# Patient Record
Sex: Female | Born: 1995 | Race: Black or African American | Hispanic: No | Marital: Single | Smoking: Current some day smoker
Health system: Southern US, Community
[De-identification: ages and names within clinical notes are randomized; demographics above are authoritative.]

---
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
## 2018-01-25 ENCOUNTER — Other Ambulatory Visit: Payer: Self-pay

## 2018-01-25 ENCOUNTER — Emergency Department: Payer: No Typology Code available for payment source

## 2018-01-25 ENCOUNTER — Encounter: Payer: Self-pay | Admitting: Emergency Medicine

## 2018-01-25 ENCOUNTER — Emergency Department
Admission: EM | Admit: 2018-01-25 | Discharge: 2018-01-25 | Disposition: A | Discharge status: Home / Self Care | Payer: No Typology Code available for payment source | Attending: Emergency Medicine | Admitting: Emergency Medicine

## 2018-01-25 DIAGNOSIS — Y92410 Unspecified street and highway as the place of occurrence of the external cause: Secondary | ICD-10-CM | POA: Diagnosis not present

## 2018-01-25 DIAGNOSIS — Y999 Unspecified external cause status: Secondary | ICD-10-CM | POA: Insufficient documentation

## 2018-01-25 DIAGNOSIS — S161XXA Strain of muscle, fascia and tendon at neck level, initial encounter: Secondary | ICD-10-CM | POA: Insufficient documentation

## 2018-01-25 DIAGNOSIS — Y9389 Activity, other specified: Secondary | ICD-10-CM | POA: Diagnosis not present

## 2018-01-25 DIAGNOSIS — S199XXA Unspecified injury of neck, initial encounter: Secondary | ICD-10-CM | POA: Diagnosis present

## 2018-01-25 DIAGNOSIS — S29019A Strain of muscle and tendon of unspecified wall of thorax, initial encounter: Secondary | ICD-10-CM | POA: Diagnosis not present

## 2018-01-25 LAB — POCT PREGNANCY, URINE: Preg Test, Ur: NEGATIVE

## 2018-01-25 MED ORDER — IBUPROFEN 600 MG PO TABS: 600.0000 mg | ORAL_TABLET | Freq: Three times a day (TID) | ORAL | 0 refills | Status: DC | PRN

## 2018-01-25 MED ORDER — IBUPROFEN 600 MG PO TABS
600.0000 mg | ORAL_TABLET | Freq: Once | ORAL | Status: AC
  Administered 2018-01-25: 600 mg via ORAL
  Filled 2018-01-25: qty 1

## 2018-01-25 MED ORDER — METHOCARBAMOL 500 MG PO TABS: 500.0000 mg | ORAL_TABLET | Freq: Four times a day (QID) | ORAL | 0 refills | Status: DC | PRN

## 2018-01-25 NOTE — Discharge Instructions (Signed)
Follow-up with your regular doctor if any continued problems.  Ibuprofen every 8 hours as needed for pain and inflammation.  Methocarbamol 1 tablet every 6 hours if needed for muscle spasms.  Do not take this medication and drive or operate machinery. Moist heat or ice to muscles as needed for discomfort or pain.
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
## 2018-01-25 NOTE — ED Notes (Signed)
Urine preg negative
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
## 2018-03-19 ENCOUNTER — Emergency Department: Payer: 59

## 2018-03-19 ENCOUNTER — Encounter: Payer: Self-pay | Admitting: Emergency Medicine

## 2018-03-19 ENCOUNTER — Emergency Department
Admission: EM | Admit: 2018-03-19 | Discharge: 2018-03-19 | Disposition: A | Discharge status: Home / Self Care | Payer: 59 | Attending: Emergency Medicine | Admitting: Emergency Medicine

## 2018-03-19 DIAGNOSIS — R202 Paresthesia of skin: Secondary | ICD-10-CM | POA: Insufficient documentation

## 2018-03-19 DIAGNOSIS — M546 Pain in thoracic spine: Secondary | ICD-10-CM

## 2018-03-19 DIAGNOSIS — M542 Cervicalgia: Secondary | ICD-10-CM | POA: Diagnosis not present

## 2018-03-19 LAB — URINALYSIS, COMPLETE (UACMP) WITH MICROSCOPIC
Bilirubin Urine: NEGATIVE
Glucose, UA: NEGATIVE mg/dL
Ketones, ur: NEGATIVE mg/dL
Leukocytes, UA: NEGATIVE
Nitrite: NEGATIVE
Protein, ur: NEGATIVE mg/dL
Specific Gravity, Urine: 1.015 (ref 1.005–1.030)
pH: 6 (ref 5.0–8.0)

## 2018-03-19 LAB — POCT PREGNANCY, URINE: Preg Test, Ur: NEGATIVE

## 2018-03-19 MED ORDER — PREDNISONE 50 MG PO TABS: ORAL_TABLET | ORAL | 0 refills | Status: DC

## 2018-03-19 NOTE — ED Notes (Signed)
ED Provider at bedside. 
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
## 2018-03-19 NOTE — ED Notes (Signed)
Pt ambulatory upon discharge. Verbalized understanding of discharge instructions, follow-up care and prescription. VSS. Skin warm and dry. A&O x4.  
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
## 2018-03-19 NOTE — ED Notes (Signed)
POC urine pregnancy test negative.
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
## 2018-03-19 NOTE — ED Notes (Signed)
XR aware that urine preg was negative. Pt ready for exam.

## 2020-05-27 ENCOUNTER — Emergency Department: Payer: Self-pay

## 2020-05-27 ENCOUNTER — Emergency Department
Admission: EM | Admit: 2020-05-27 | Discharge: 2020-05-27 | Disposition: A | Discharge status: Home / Self Care | Payer: Self-pay | Attending: Emergency Medicine | Admitting: Emergency Medicine

## 2020-05-27 ENCOUNTER — Other Ambulatory Visit: Payer: Self-pay

## 2020-05-27 ENCOUNTER — Encounter: Payer: Self-pay | Admitting: Emergency Medicine

## 2020-05-27 DIAGNOSIS — R197 Diarrhea, unspecified: Secondary | ICD-10-CM | POA: Insufficient documentation

## 2020-05-27 DIAGNOSIS — J020 Streptococcal pharyngitis: Secondary | ICD-10-CM | POA: Insufficient documentation

## 2020-05-27 DIAGNOSIS — Z20822 Contact with and (suspected) exposure to covid-19: Secondary | ICD-10-CM | POA: Insufficient documentation

## 2020-05-27 LAB — URINALYSIS, COMPLETE (UACMP) WITH MICROSCOPIC
Bacteria, UA: NONE SEEN
Bilirubin Urine: NEGATIVE
Glucose, UA: NEGATIVE mg/dL
Ketones, ur: 5 mg/dL — AB
Leukocytes,Ua: NEGATIVE
Nitrite: NEGATIVE
Protein, ur: 30 mg/dL — AB
Specific Gravity, Urine: 1.016 (ref 1.005–1.030)
pH: 7 (ref 5.0–8.0)

## 2020-05-27 LAB — COMPREHENSIVE METABOLIC PANEL
ALT: 13 U/L (ref 0–44)
AST: 18 U/L (ref 15–41)
Albumin: 4.4 g/dL (ref 3.5–5.0)
Alkaline Phosphatase: 53 U/L (ref 38–126)
Anion gap: 10 (ref 5–15)
BUN: 6 mg/dL (ref 6–20)
CO2: 24 mmol/L (ref 22–32)
Calcium: 9.3 mg/dL (ref 8.9–10.3)
Chloride: 98 mmol/L (ref 98–111)
Creatinine, Ser: 0.89 mg/dL (ref 0.44–1.00)
GFR calc Af Amer: >60 mL/min (ref >60)
GFR calc non Af Amer: >60 mL/min (ref >60)
Glucose, Bld: 150 mg/dL — ABNORMAL HIGH (ref 70–99)
Potassium: 3.4 mmol/L — ABNORMAL LOW (ref 3.5–5.1)
Sodium: 132 mmol/L — ABNORMAL LOW (ref 135–145)
Total Bilirubin: 0.6 mg/dL (ref 0.3–1.2)
Total Protein: 8.8 g/dL — ABNORMAL HIGH (ref 6.5–8.1)

## 2020-05-27 LAB — LIPASE, BLOOD: Lipase: 27 U/L (ref 11–51)

## 2020-05-27 LAB — CBC
HCT: 38.6 % (ref 36.0–46.0)
Hemoglobin: 12.8 g/dL (ref 12.0–15.0)
MCH: 29.6 pg (ref 26.0–34.0)
MCHC: 33.2 g/dL (ref 30.0–36.0)
MCV: 89.4 fL (ref 80.0–100.0)
Platelets: 288 10*3/uL (ref 150–400)
RBC: 4.32 MIL/uL (ref 3.87–5.11)
RDW: 12.5 % (ref 11.5–15.5)
WBC: 10.9 10*3/uL — ABNORMAL HIGH (ref 4.0–10.5)
nRBC: 0 % (ref 0.0–0.2)

## 2020-05-27 LAB — MONONUCLEOSIS SCREEN: Mono Screen: NEGATIVE

## 2020-05-27 LAB — POCT PREGNANCY, URINE: Preg Test, Ur: NEGATIVE

## 2020-05-27 LAB — SARS CORONAVIRUS 2 BY RT PCR (HOSPITAL ORDER, PERFORMED IN ~~LOC~~ HOSPITAL LAB): SARS Coronavirus 2: NEGATIVE

## 2020-05-27 LAB — GROUP A STREP BY PCR: Group A Strep by PCR: NOT DETECTED

## 2020-05-27 MED ORDER — ONDANSETRON 4 MG PO TBDP
4.0000 mg | ORAL_TABLET | Freq: Three times a day (TID) | ORAL | 0 refills | Status: DC | PRN
Start: 2020-05-27 — End: 2021-03-27

## 2020-05-27 MED ORDER — AMOXICILLIN 500 MG PO CAPS
1000.0000 mg | ORAL_CAPSULE | Freq: Once | ORAL | Status: AC
  Administered 2020-05-27: 1000 mg via ORAL
  Filled 2020-05-27: qty 2

## 2020-05-27 MED ORDER — MAGIC MOUTHWASH W/LIDOCAINE
5.0000 mL | Freq: Four times a day (QID) | ORAL | 0 refills | Status: DC
Start: 2020-05-27 — End: 2022-05-27

## 2020-05-27 MED ORDER — SODIUM CHLORIDE 0.9 % IV BOLUS
1000.0000 mL | Freq: Once | INTRAVENOUS | Status: AC
  Administered 2020-05-27: 1000 mL via INTRAVENOUS

## 2020-05-27 MED ORDER — DEXAMETHASONE SODIUM PHOSPHATE 10 MG/ML IJ SOLN
10.0000 mg | Freq: Once | INTRAMUSCULAR | Status: AC
  Administered 2020-05-27: 10 mg via INTRAVENOUS
  Filled 2020-05-27: qty 1

## 2020-05-27 MED ORDER — AMOXICILLIN 875 MG PO TABS: 875.0000 mg | ORAL_TABLET | Freq: Two times a day (BID) | ORAL | 0 refills | Status: DC

## 2020-05-27 MED ORDER — ACETAMINOPHEN 325 MG PO TABS
650.0000 mg | ORAL_TABLET | Freq: Once | ORAL | Status: AC | PRN
  Administered 2020-05-27: 650 mg via ORAL
  Filled 2020-05-27: qty 2

## 2020-05-27 NOTE — ED Notes (Signed)
Tonsils swollen 3+ and red with white spots. Strep swab ordered.

## 2020-05-27 NOTE — ED Provider Notes (Signed)
[facility] Emergency Department Provider Note  ____________________________________________  Time seen: Approximately 2:20 PM  I have reviewed the triage vital signs and the nursing notes.   HISTORY  Chief Complaint Emesis    HPI Rachael Walsh is a 24 y.o. female who presents the emergency department complaining of sore throat,  nausea, vomiting, diarrhea, body aches.  Patient states that she has been having symptoms for 3 to 5 days.  Patient has no difficulty breathing or swallowing.  She is able to drink fluids but states that food makes her nauseated and causes emesis.  She has had multiple episodes of diarrhea but no hematemesis and no hematochezia.  Patient with no abdominal pain.  No cough or shortness of breath.  No medications prior to arrival.        History reviewed. No pertinent past medical history.  There are no problems to display for this patient.   History reviewed. No pertinent surgical history.  Prior to Admission medications   Medication Sig Start Date End Date Taking? Authorizing Provider  amoxicillin (AMOXIL) 875 MG tablet Take 1 tablet (875 mg total) by mouth 2 (two) times daily. 05/27/20   Samwise Eckardt, Charline Bills, PA-C  ibuprofen (ADVIL,MOTRIN) 600 MG tablet Take 1 tablet (600 mg total) by mouth every 8 (eight) hours as needed. 01/25/18   Johnn Hai, PA-C  magic mouthwash w/lidocaine SOLN Take 5 mLs by mouth 4 (four) times daily. 05/27/20   Aneesa Romey, Charline Bills, PA-C  methocarbamol (ROBAXIN) 500 MG tablet Take 1 tablet (500 mg total) by mouth every 6 (six) hours as needed for muscle spasms. 01/25/18   Johnn Hai, PA-C  ondansetron (ZOFRAN-ODT) 4 MG disintegrating tablet Take 1 tablet (4 mg total) by mouth every 8 (eight) hours as needed for nausea or vomiting. 05/27/20   Latish Toutant, Charline Bills, PA-C  predniSONE (DELTASONE) 50 MG tablet Take one 50 mg tablet once daily for the next five days. 03/19/18   Lannie Fields, PA-C     Allergies Patient has no known allergies.  History reviewed. No pertinent family history.  Social History Social History   Tobacco Use  . Smoking status: Never Smoker  . Smokeless tobacco: Never Used  Substance Use Topics  . Alcohol use: No  . Drug use: No     Review of Systems  Constitutional: No fever/chills Eyes: No visual changes. No discharge ENT: Positive for sore throat Cardiovascular: no chest pain. Respiratory: no cough. No SOB. Gastrointestinal: No abdominal pain.  Positive for nausea, vomiting, diarrhea..  No constipation. Genitourinary: Negative for dysuria. No hematuria Musculoskeletal: Negative for musculoskeletal pain. Skin: Negative for rash, abrasions, lacerations, ecchymosis. Neurological: Negative for headaches, focal weakness or numbness. 10-point ROS otherwise negative.  ____________________________________________   PHYSICAL EXAM:  VITAL SIGNS: ED Triage Vitals  Enc Vitals Group     BP 05/27/20 1224 116/69     Pulse Rate 05/27/20 1224 (!) 117     Resp 05/27/20 1224 20     Temp 05/27/20 1224 (!) 102.7 F (39.3 C)     Temp Source 05/27/20 1224 Oral     SpO2 05/27/20 1224 100 %     Weight 05/27/20 1220 180 lb (81.6 kg)     Height 05/27/20 1220 5\' 2"  (1.575 m)     Head Circumference --      Peak Flow --      Pain Score 05/27/20 1220 7     Pain Loc --      Pain Edu? --  Excl. in GC? --      Constitutional: Alert and oriented. Well appearing and in no acute distress. Eyes: Conjunctivae are normal. PERRL. EOMI. Head: Atraumatic. ENT:      Ears:       Nose: No congestion/rhinnorhea.      Mouth/Throat: Mucous membranes are moist.  Tonsils are bilateral hypertrophied with exudates.  No unequal tonsillar hypertrophy.  Exudates bilaterally.  No uvular deviation.  Submandibular and anterior neck are nontender to palpation with no erythema or edema identified. Neck: No stridor.  No cervical spine tenderness to  palpation. Hematological/Lymphatic/Immunilogical: Scattered, nontender anterior and posterior cervical lymphadenopathy. Cardiovascular: Normal rate, regular rhythm. Normal S1 and S2.  Good peripheral circulation. Respiratory: Normal respiratory effort without tachypnea or retractions. Lungs CTAB. Good air entry to the bases with no decreased or absent breath sounds. Gastrointestinal: Bowel sounds 4 quadrants. Soft and nontender to palpation. No guarding or rigidity. No palpable masses. No distention. No CVA tenderness Musculoskeletal: Full range of motion to all extremities. No gross deformities appreciated. Neurologic:  Normal speech and language. No gross focal neurologic deficits are appreciated.  Skin:  Skin is warm, dry and intact. No rash noted. Psychiatric: Mood and affect are normal. Speech and behavior are normal. Patient exhibits appropriate insight and judgement.   ____________________________________________   LABS (all labs ordered are listed, but only abnormal results are displayed)  Labs Reviewed  COMPREHENSIVE METABOLIC PANEL - Abnormal; Notable for the following components:      Result Value   Sodium 132 (*)    Potassium 3.4 (*)    Glucose, Bld 150 (*)    Total Protein 8.8 (*)    All other components within normal limits  CBC - Abnormal; Notable for the following components:   WBC 10.9 (*)    All other components within normal limits  URINALYSIS, COMPLETE (UACMP) WITH MICROSCOPIC - Abnormal; Notable for the following components:   Color, Urine YELLOW (*)    APPearance CLOUDY (*)    Hgb urine dipstick MODERATE (*)    Ketones, ur 5 (*)    Protein, ur 30 (*)    All other components within normal limits  GROUP A STREP BY PCR  SARS CORONAVIRUS 2 BY RT PCR (HOSPITAL ORDER, PERFORMED IN [facility])  LIPASE, BLOOD  MONONUCLEOSIS SCREEN  POCT PREGNANCY, URINE  POC URINE PREG, ED    ____________________________________________  EKG   ____________________________________________  RADIOLOGY   DG Chest 2 View  Result Date: 05/27/2020 CLINICAL DATA:  Vomiting for 3 days, unable to keep food down, diarrhea today, smoker EXAM: CHEST - 2 VIEW COMPARISON:  None FINDINGS: Normal heart size, mediastinal contours, and pulmonary vascularity. Lungs clear. No pleural effusion or pneumothorax. Bones unremarkable. Artifacts from patient's hair project over LEFT upper lobe. IMPRESSION: Normal exam. Electronically Signed   By: Ulyses Southward M.D.   On: 05/27/2020 14:42    ____________________________________________    PROCEDURES  Procedure(s) performed:    Procedures    Medications  acetaminophen (TYLENOL) tablet 650 mg (650 mg Oral Given 05/27/20 1235)  sodium chloride 0.9 % bolus 1,000 mL (0 mLs Intravenous Stopped 05/27/20 1749)  dexamethasone (DECADRON) injection 10 mg (10 mg Intravenous Given 05/27/20 1750)  amoxicillin (AMOXIL) capsule 1,000 mg (1,000 mg Oral Given 05/27/20 1749)     ____________________________________________   INITIAL IMPRESSION / ASSESSMENT AND PLAN / ED COURSE  Pertinent labs & imaging results that were available during my care of the patient were reviewed by me and considered  in my medical decision making (see chart for details).  Review of the [place] CSRS was performed in accordance of the [facility] prior to dispensing any controlled drugs.           Patient's diagnosis is consistent with strep pharyngitis.  Patient presented to emergency department with fever, sore throat.  Patient states that she had been nauseated, having difficulty keeping food down.  She is also experiencing diarrhea.  Differential included mononucleosis, strep pharyngitis, COVID-19, peritonsillar abscess, retropharyngeal abscess, viral URI.  On exam, was most concerned for mononucleosis versus strep.  Strep and mono test were negative, however I feel that patient still likely  has strep.  I will treat the patient with antibiotics and a single dose of steroid here for tonsillar edema.  Patient had no evidence of an equal tonsillar hypertrophy or uvular deviation.  No indication for imaging at this time..  Patient will be prescribed amoxicillin, Magic mouthwash for symptom relief and Zofran to help with nausea and emesis.  Return precautions discussed with the patient.  Follow-up with primary care as needed.  Patient is given ED precautions to return to the ED for any worsening or new symptoms.     ____________________________________________  FINAL CLINICAL IMPRESSION(S) / ED DIAGNOSES  Final diagnoses:  Strep pharyngitis      NEW MEDICATIONS STARTED DURING THIS VISIT:  ED Discharge Orders         Ordered    amoxicillin (AMOXIL) 875 MG tablet  2 times daily     05/27/20 1734    magic mouthwash w/lidocaine SOLN  4 times daily    Note to Pharmacy: Dispense in a 1/1/1 ratio. Use lidocaine, diphenhydramine, prednisolone   05/27/20 1734    ondansetron (ZOFRAN-ODT) 4 MG disintegrating tablet  Every 8 hours PRN     05/27/20 1734              This chart was dictated using voice recognition software/Dragon. Despite best efforts to proofread, errors can occur which can change the meaning. Any change was purely unintentional.    Racheal Patches, PA-C 05/27/20 1807    Minna Antis, MD 05/28/20 2147

## 2020-05-27 NOTE — ED Triage Notes (Signed)
Pt here for vomiting for 3 days and unable to keep food down. Diarrhea X 5 today.  No abdominal pain. Generalized body aches. + sore throat.

## 2020-05-27 NOTE — ED Triage Notes (Signed)
First nurse note- here for vomiting, can keep liquids down but not food. Also c/o sore throat and chills. Ambulatory, NAD

## 2020-09-03 ENCOUNTER — Encounter: Payer: Self-pay | Admitting: Emergency Medicine

## 2020-09-03 ENCOUNTER — Emergency Department: Payer: PRIVATE HEALTH INSURANCE

## 2020-09-03 ENCOUNTER — Other Ambulatory Visit: Payer: Self-pay

## 2020-09-03 ENCOUNTER — Emergency Department
Admission: EM | Admit: 2020-09-03 | Discharge: 2020-09-04 | Disposition: A | Discharge status: Home / Self Care | Payer: PRIVATE HEALTH INSURANCE | Attending: Emergency Medicine | Admitting: Emergency Medicine

## 2020-09-03 DIAGNOSIS — Y92410 Unspecified street and highway as the place of occurrence of the external cause: Secondary | ICD-10-CM | POA: Diagnosis not present

## 2020-09-03 DIAGNOSIS — J039 Acute tonsillitis, unspecified: Secondary | ICD-10-CM | POA: Insufficient documentation

## 2020-09-03 DIAGNOSIS — Y9389 Activity, other specified: Secondary | ICD-10-CM | POA: Diagnosis not present

## 2020-09-03 DIAGNOSIS — M542 Cervicalgia: Secondary | ICD-10-CM | POA: Diagnosis not present

## 2020-09-03 DIAGNOSIS — Z20822 Contact with and (suspected) exposure to covid-19: Secondary | ICD-10-CM | POA: Insufficient documentation

## 2020-09-03 DIAGNOSIS — R0789 Other chest pain: Secondary | ICD-10-CM | POA: Insufficient documentation

## 2020-09-03 DIAGNOSIS — Y999 Unspecified external cause status: Secondary | ICD-10-CM | POA: Insufficient documentation

## 2020-09-03 DIAGNOSIS — R509 Fever, unspecified: Secondary | ICD-10-CM | POA: Diagnosis not present

## 2020-09-03 DIAGNOSIS — E876 Hypokalemia: Secondary | ICD-10-CM

## 2020-09-03 DIAGNOSIS — J029 Acute pharyngitis, unspecified: Secondary | ICD-10-CM | POA: Diagnosis present

## 2020-09-03 LAB — CBC WITH DIFFERENTIAL/PLATELET
Abs Immature Granulocytes: 0.03 10*3/uL (ref 0.00–0.07)
Basophils Absolute: 0 10*3/uL (ref 0.0–0.1)
Basophils Relative: 0 %
Eosinophils Absolute: 0 10*3/uL (ref 0.0–0.5)
Eosinophils Relative: 0 %
HCT: 37.3 % (ref 36.0–46.0)
Hemoglobin: 13 g/dL (ref 12.0–15.0)
Immature Granulocytes: 0 %
Lymphocytes Relative: 24 %
Lymphs Abs: 1.8 10*3/uL (ref 0.7–4.0)
MCH: 30.1 pg (ref 26.0–34.0)
MCHC: 34.9 g/dL (ref 30.0–36.0)
MCV: 86.3 fL (ref 80.0–100.0)
Monocytes Absolute: 0.9 10*3/uL (ref 0.1–1.0)
Monocytes Relative: 12 %
Neutro Abs: 5 10*3/uL (ref 1.7–7.7)
Neutrophils Relative %: 64 %
Platelets: 273 10*3/uL (ref 150–400)
RBC: 4.32 MIL/uL (ref 3.87–5.11)
RDW: 13.2 % (ref 11.5–15.5)
WBC: 7.8 10*3/uL (ref 4.0–10.5)
nRBC: 0 % (ref 0.0–0.2)

## 2020-09-03 LAB — URINALYSIS, COMPLETE (UACMP) WITH MICROSCOPIC
Bilirubin Urine: NEGATIVE
Glucose, UA: NEGATIVE mg/dL
Ketones, ur: 20 mg/dL — AB
Leukocytes,Ua: NEGATIVE
Nitrite: NEGATIVE
Protein, ur: 100 mg/dL — AB
Specific Gravity, Urine: 1.031 — ABNORMAL HIGH (ref 1.005–1.030)
pH: 5 (ref 5.0–8.0)

## 2020-09-03 LAB — BASIC METABOLIC PANEL
Anion gap: 11 (ref 5–15)
BUN: 5 mg/dL — ABNORMAL LOW (ref 6–20)
CO2: 26 mmol/L (ref 22–32)
Calcium: 9.2 mg/dL (ref 8.9–10.3)
Chloride: 96 mmol/L — ABNORMAL LOW (ref 98–111)
Creatinine, Ser: 0.75 mg/dL (ref 0.44–1.00)
GFR calc Af Amer: >60 mL/min (ref >60)
GFR calc non Af Amer: >60 mL/min (ref >60)
Glucose, Bld: 109 mg/dL — ABNORMAL HIGH (ref 70–99)
Potassium: 3 mmol/L — ABNORMAL LOW (ref 3.5–5.1)
Sodium: 133 mmol/L — ABNORMAL LOW (ref 135–145)

## 2020-09-03 LAB — MONONUCLEOSIS SCREEN: Mono Screen: NEGATIVE

## 2020-09-03 LAB — POCT PREGNANCY, URINE: Preg Test, Ur: NEGATIVE

## 2020-09-03 LAB — GROUP A STREP BY PCR: Group A Strep by PCR: NOT DETECTED

## 2020-09-03 LAB — SARS CORONAVIRUS 2 BY RT PCR (HOSPITAL ORDER, PERFORMED IN ~~LOC~~ HOSPITAL LAB): SARS Coronavirus 2: NEGATIVE

## 2020-09-03 MED ORDER — LIDOCAINE VISCOUS HCL 2 % MT SOLN
15.0000 mL | Freq: Once | OROMUCOSAL | Status: AC
  Administered 2020-09-03: 15 mL via OROMUCOSAL
  Filled 2020-09-03: qty 15

## 2020-09-03 MED ORDER — POTASSIUM CHLORIDE CRYS ER 20 MEQ PO TBCR
40.0000 meq | EXTENDED_RELEASE_TABLET | Freq: Once | ORAL | Status: AC
  Administered 2020-09-03: 40 meq via ORAL
  Filled 2020-09-03: qty 2

## 2020-09-03 MED ORDER — SODIUM CHLORIDE 0.9 % IV SOLN
3.0000 g | Freq: Once | INTRAVENOUS | Status: AC
  Administered 2020-09-03: 3 g via INTRAVENOUS
  Filled 2020-09-03: qty 8

## 2020-09-03 MED ORDER — ACETAMINOPHEN 325 MG PO TABS
650.0000 mg | ORAL_TABLET | Freq: Once | ORAL | Status: AC | PRN
  Administered 2020-09-03: 650 mg via ORAL
  Filled 2020-09-03: qty 2

## 2020-09-03 MED ORDER — DEXAMETHASONE SODIUM PHOSPHATE 10 MG/ML IJ SOLN
10.0000 mg | Freq: Once | INTRAMUSCULAR | Status: AC
  Administered 2020-09-03: 10 mg via INTRAVENOUS
  Filled 2020-09-03: qty 1

## 2020-09-03 MED ORDER — IBUPROFEN 600 MG PO TABS
600.0000 mg | ORAL_TABLET | Freq: Once | ORAL | Status: AC
  Administered 2020-09-03: 600 mg via ORAL
  Filled 2020-09-03: qty 1

## 2020-09-03 MED ORDER — SODIUM CHLORIDE 0.9 % IV BOLUS
1000.0000 mL | Freq: Once | INTRAVENOUS | Status: AC
  Administered 2020-09-03: 1000 mL via INTRAVENOUS
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
## 2020-09-04 MED ORDER — AMOXICILLIN-POT CLAVULANATE 875-125 MG PO TABS: 1.0000 | ORAL_TABLET | Freq: Two times a day (BID) | ORAL | 0 refills | Status: AC

## 2020-09-04 MED ORDER — IBUPROFEN 600 MG PO TABS: 600.0000 mg | ORAL_TABLET | Freq: Four times a day (QID) | ORAL | 0 refills | Status: AC | PRN

## 2020-09-04 MED ORDER — POTASSIUM CHLORIDE ER 10 MEQ PO TBCR: 10.0000 meq | EXTENDED_RELEASE_TABLET | Freq: Every day | ORAL | 0 refills | Status: AC

## 2020-09-05 LAB — URINE CULTURE

## 2021-03-27 ENCOUNTER — Emergency Department
Admission: EM | Admit: 2021-03-27 | Discharge: 2021-03-27 | Disposition: A | Discharge status: Home / Self Care | Payer: Managed Care, Other (non HMO) | Attending: Emergency Medicine | Admitting: Emergency Medicine

## 2021-03-27 ENCOUNTER — Other Ambulatory Visit: Payer: Self-pay

## 2021-03-27 DIAGNOSIS — F17200 Nicotine dependence, unspecified, uncomplicated: Secondary | ICD-10-CM | POA: Insufficient documentation

## 2021-03-27 DIAGNOSIS — R1012 Left upper quadrant pain: Secondary | ICD-10-CM | POA: Diagnosis present

## 2021-03-27 DIAGNOSIS — K2920 Alcoholic gastritis without bleeding: Secondary | ICD-10-CM

## 2021-03-27 LAB — CBC WITH DIFFERENTIAL/PLATELET
Abs Immature Granulocytes: 0.02 10*3/uL (ref 0.00–0.07)
Basophils Absolute: 0 10*3/uL (ref 0.0–0.1)
Basophils Relative: 0 %
Eosinophils Absolute: 0.1 10*3/uL (ref 0.0–0.5)
Eosinophils Relative: 1 %
HCT: 34.6 % — ABNORMAL LOW (ref 36.0–46.0)
Hemoglobin: 11.2 g/dL — ABNORMAL LOW (ref 12.0–15.0)
Immature Granulocytes: 0 %
Lymphocytes Relative: 47 %
Lymphs Abs: 2.2 10*3/uL (ref 0.7–4.0)
MCH: 29.3 pg (ref 26.0–34.0)
MCHC: 32.4 g/dL (ref 30.0–36.0)
MCV: 90.6 fL (ref 80.0–100.0)
Monocytes Absolute: 0.4 10*3/uL (ref 0.1–1.0)
Monocytes Relative: 9 %
Neutro Abs: 2.1 10*3/uL (ref 1.7–7.7)
Neutrophils Relative %: 43 %
Platelets: 336 10*3/uL (ref 150–400)
RBC: 3.82 MIL/uL — ABNORMAL LOW (ref 3.87–5.11)
RDW: 13 % (ref 11.5–15.5)
WBC: 4.8 10*3/uL (ref 4.0–10.5)
nRBC: 0 % (ref 0.0–0.2)

## 2021-03-27 LAB — COMPREHENSIVE METABOLIC PANEL
ALT: 14 U/L (ref 0–44)
AST: 18 U/L (ref 15–41)
Albumin: 3.9 g/dL (ref 3.5–5.0)
Alkaline Phosphatase: 36 U/L — ABNORMAL LOW (ref 38–126)
Anion gap: 5 (ref 5–15)
BUN: 9 mg/dL (ref 6–20)
CO2: 23 mmol/L (ref 22–32)
Calcium: 9.1 mg/dL (ref 8.9–10.3)
Chloride: 107 mmol/L (ref 98–111)
Creatinine, Ser: 0.73 mg/dL (ref 0.44–1.00)
GFR, Estimated: >60 mL/min (ref >60)
Glucose, Bld: 102 mg/dL — ABNORMAL HIGH (ref 70–99)
Potassium: 3.9 mmol/L (ref 3.5–5.1)
Sodium: 135 mmol/L (ref 135–145)
Total Bilirubin: 0.7 mg/dL (ref 0.3–1.2)
Total Protein: 7.3 g/dL (ref 6.5–8.1)

## 2021-03-27 LAB — LIPASE, BLOOD: Lipase: 33 U/L (ref 11–51)

## 2021-03-27 MED ORDER — DEXTROSE IN LACTATED RINGERS 5 % IV SOLN
1000.0000 mL | Freq: Once | INTRAVENOUS | Status: AC
  Administered 2021-03-27: 1000 mL via INTRAVENOUS

## 2021-03-27 MED ORDER — FAMOTIDINE 20 MG PO TABS
40.0000 mg | ORAL_TABLET | Freq: Once | ORAL | Status: AC
  Administered 2021-03-27: 40 mg via ORAL
  Filled 2021-03-27: qty 2

## 2021-03-27 MED ORDER — ONDANSETRON 4 MG PO TBDP: 4.0000 mg | ORAL_TABLET | Freq: Three times a day (TID) | ORAL | 0 refills | Status: AC | PRN

## 2021-03-27 MED ORDER — ALUMINUM-MAGNESIUM-SIMETHICONE 200-200-20 MG/5ML PO SUSP: 30.0000 mL | Freq: Three times a day (TID) | ORAL | 0 refills | Status: DC

## 2021-03-27 MED ORDER — PANTOPRAZOLE SODIUM 40 MG IV SOLR
40.0000 mg | Freq: Once | INTRAVENOUS | Status: AC
  Administered 2021-03-27: 40 mg via INTRAVENOUS
  Filled 2021-03-27: qty 40

## 2021-03-27 MED ORDER — DEXTROSE 5 % AND 0.9 % NACL IV BOLUS
1000.0000 mL | Freq: Once | INTRAVENOUS | Status: DC
  Filled 2021-03-27: qty 1000

## 2021-03-27 MED ORDER — ONDANSETRON HCL 4 MG/2ML IJ SOLN
4.0000 mg | Freq: Once | INTRAMUSCULAR | Status: AC
  Administered 2021-03-27: 4 mg via INTRAVENOUS
  Filled 2021-03-27: qty 2

## 2021-03-27 MED ORDER — ALUM & MAG HYDROXIDE-SIMETH 200-200-20 MG/5ML PO SUSP
30.0000 mL | Freq: Once | ORAL | Status: AC
  Administered 2021-03-27: 30 mL via ORAL
  Filled 2021-03-27: qty 30

## 2021-03-27 MED ORDER — FAMOTIDINE 20 MG PO TABS: 20.0000 mg | ORAL_TABLET | Freq: Two times a day (BID) | ORAL | 0 refills | Status: AC

## 2021-03-27 NOTE — ED Notes (Signed)
Pt given crackers, PB, and ginger ale for PO challenge per Dr Scotty Court

## 2021-03-27 NOTE — ED Provider Notes (Signed)
[facility] Emergency Department Provider Note  ____________________________________________  Time seen: Approximately 10:02 AM  I have reviewed the triage vital signs and the nursing notes.   HISTORY  Chief Complaint Emesis    HPI Rachael Walsh is a 25 y.o. female with no significant past medical history who complains of left upper quadrant abdominal pain and vomiting for the past 4 days which started after a heavy drinking episode.  She does not routinely drink excessively.  Pain is constant, waxing and waning, nonradiating, moderate intensity.  She has had intermittent vomiting, decreased ability to eat and drink.   No constipation or diarrhea, no fever or body aches.  No respiratory symptoms, no chest pain.  Has had a slight amount of bloody streak in emesis, no significant hematemesis.     History reviewed. No pertinent past medical history.   There are no problems to display for this patient.    History reviewed. No pertinent surgical history.   Prior to Admission medications   Medication Sig Start Date End Date Taking? Authorizing Provider  aluminum-magnesium hydroxide-simethicone (MAALOX) 200-200-20 MG/5ML SUSP Take 30 mLs by mouth 4 (four) times daily -  before meals and at bedtime. 03/27/21  Yes Sharman Cheek, MD  famotidine (PEPCID) 20 MG tablet Take 1 tablet (20 mg total) by mouth 2 (two) times daily. 03/27/21  Yes Sharman Cheek, MD  ondansetron (ZOFRAN ODT) 4 MG disintegrating tablet Take 1 tablet (4 mg total) by mouth every 8 (eight) hours as needed for nausea or vomiting. 03/27/21  Yes Sharman Cheek, MD  ibuprofen (ADVIL) 600 MG tablet Take 1 tablet (600 mg total) by mouth every 6 (six) hours as needed. 09/04/20   Enid Derry, PA-C  magic mouthwash w/lidocaine SOLN Take 5 mLs by mouth 4 (four) times daily. 05/27/20   Cuthriell, Delorise Royals, PA-C  methocarbamol (ROBAXIN) 500 MG tablet Take 1 tablet (500 mg total) by mouth every 6  (six) hours as needed for muscle spasms. 01/25/18   Tommi Rumps, PA-C  potassium chloride (KLOR-CON) 10 MEQ tablet Take 1 tablet (10 mEq total) by mouth daily. 09/04/20   Enid Derry, PA-C  predniSONE (DELTASONE) 50 MG tablet Take one 50 mg tablet once daily for the next five days. 03/19/18   Orvil Feil, PA-C     Allergies Patient has no known allergies.   History reviewed. No pertinent family history.  Social History Social History   Tobacco Use  . Smoking status: Current Every Day Smoker  . Smokeless tobacco: Never Used  Vaping Use  . Vaping Use: Every day  Substance Use Topics  . Alcohol use: Yes  . Drug use: Yes    Types: Marijuana    Review of Systems  Constitutional:   No fever or chills.  ENT:   No sore throat. No rhinorrhea. Cardiovascular:   No chest pain or syncope. Respiratory:   No dyspnea or cough. Gastrointestinal: Positive for abdominal pain as above with vomiting Musculoskeletal:   Negative for focal pain or swelling All other systems reviewed and are negative except as documented above in ROS and HPI.  ____________________________________________   PHYSICAL EXAM:  VITAL SIGNS: ED Triage Vitals [03/27/21 0748]  Enc Vitals Group     BP 114/80     Pulse Rate 69     Resp 18     Temp 98.4 F (36.9 C)     Temp Source Oral     SpO2 100 %     Weight 185 lb (  83.9 kg)     Height 5\' 2"  (1.575 m)     Head Circumference      Peak Flow      Pain Score 7     Pain Loc      Pain Edu?      Excl. in GC?     Vital signs reviewed, nursing assessments reviewed.   Constitutional:   Alert and oriented. Non-toxic appearance. Eyes:   Conjunctivae are normal. EOMI. PERRL. ENT      Head:   Normocephalic and atraumatic.      Nose:   Wearing a mask.      Mouth/Throat:   Wearing a mask.      Neck:   No meningismus. Full ROM. Hematological/Lymphatic/Immunilogical:   No cervical lymphadenopathy. Cardiovascular:   RRR. Symmetric bilateral radial and DP  pulses.  No murmurs. Cap refill less than 2 seconds. Respiratory:   Normal respiratory effort without tachypnea/retractions. Breath sounds are clear and equal bilaterally. No wheezes/rales/rhonchi. Gastrointestinal:   Soft with left upper quadrant tenderness. Non distended. There is no CVA tenderness.  No rebound, rigidity, or guarding. Genitourinary:   deferred Musculoskeletal:   Normal range of motion in all extremities. No joint effusions.  No lower extremity tenderness.  No edema. Neurologic:   Normal speech and language.  Motor grossly intact. No acute focal neurologic deficits are appreciated.  Skin:    Skin is warm, dry and intact. No rash noted.  No petechiae, purpura, or bullae.  ____________________________________________    LABS (pertinent positives/negatives) (all labs ordered are listed, but only abnormal results are displayed) Labs Reviewed  COMPREHENSIVE METABOLIC PANEL - Abnormal; Notable for the following components:      Result Value   Glucose, Bld 102 (*)    Alkaline Phosphatase 36 (*)    All other components within normal limits  CBC WITH DIFFERENTIAL/PLATELET - Abnormal; Notable for the following components:   RBC 3.82 (*)    Hemoglobin 11.2 (*)    HCT 34.6 (*)    All other components within normal limits  LIPASE, BLOOD   ____________________________________________   EKG    ____________________________________________    RADIOLOGY  No results found.  ____________________________________________   PROCEDURES Procedures  ____________________________________________    CLINICAL IMPRESSION / ASSESSMENT AND PLAN / ED COURSE  Medications ordered in the ED: Medications  ondansetron (ZOFRAN) injection 4 mg (4 mg Intravenous Given 03/27/21 0815)  pantoprazole (PROTONIX) injection 40 mg (40 mg Intravenous Given 03/27/21 0815)  alum & mag hydroxide-simeth (MAALOX/MYLANTA) 200-200-20 MG/5ML suspension 30 mL (30 mLs Oral Given 03/27/21 0815)  famotidine  (PEPCID) tablet 40 mg (40 mg Oral Given 03/27/21 0815)  dextrose 5 % in lactated ringers infusion (1,000 mLs Intravenous New Bag/Given 03/27/21 0815)    Pertinent labs & imaging results that were available during my care of the patient were reviewed by me and considered in my medical decision making (see chart for details).  Rachael Walsh was evaluated in Emergency Department on 03/27/2021 for the symptoms described in the history of present illness. She was evaluated in the context of the global COVID-19 pandemic, which necessitated consideration that the patient might be at risk for infection with the SARS-CoV-2 virus that causes COVID-19. Institutional protocols and algorithms that pertain to the evaluation of patients at risk for COVID-19 are in a state of rapid change based on information released by regulatory bodies including the CDC and federal and state organizations. These policies and algorithms were followed during the patient's care in  the ED.   Patient presents with left upper quadrant pain and vomiting after session of heavy drinking, consistent with alcoholic gastritis.  Exam is consistent with gastritis as well, vital signs are normal, patient is nontoxic and overall exam is reassuring. Considering the patient's symptoms, medical history, and physical examination today, I have low suspicion for cholecystitis or biliary pathology, pancreatitis, perforation or bowel obstruction, hernia, intra-abdominal abscess, AAA or dissection, volvulus or intussusception, mesenteric ischemia, or appendicitis.  Patient given IV fluids antiemetics antacids and feeling better, tolerating p.o.  Will provide prescriptions for continued outpatient management, work note.      ____________________________________________   FINAL CLINICAL IMPRESSION(S) / ED DIAGNOSES    Final diagnoses:  Acute alcoholic gastritis without hemorrhage     ED Discharge Orders         Ordered    famotidine (PEPCID) 20 MG  tablet  2 times daily        03/27/21 1002    ondansetron (ZOFRAN ODT) 4 MG disintegrating tablet  Every 8 hours PRN        03/27/21 1002    aluminum-magnesium hydroxide-simethicone (MAALOX) 200-200-20 MG/5ML SUSP  3 times daily before meals & bedtime        03/27/21 1002          Portions of this note were generated with dragon dictation software. Dictation errors may occur despite best attempts at proofreading.   Sharman Cheek, MD 03/27/21 1005

## 2021-03-27 NOTE — ED Triage Notes (Signed)
Pt to ER via POV. Pt reports drinking heavily on Saturday and has had multiple episodes of emesis since, reports red streaks when she spits up. Pt also reports non-radiating left sided abdominal pain. Denies other symptoms.

## 2021-06-01 ENCOUNTER — Other Ambulatory Visit: Payer: Self-pay

## 2021-06-01 ENCOUNTER — Emergency Department
Admission: EM | Admit: 2021-06-01 | Discharge: 2021-06-02 | Disposition: A | Discharge status: Home / Self Care | Payer: Managed Care, Other (non HMO) | Attending: Emergency Medicine | Admitting: Emergency Medicine

## 2021-06-01 DIAGNOSIS — B373 Candidiasis of vulva and vagina: Secondary | ICD-10-CM | POA: Diagnosis not present

## 2021-06-01 DIAGNOSIS — L292 Pruritus vulvae: Secondary | ICD-10-CM | POA: Diagnosis present

## 2021-06-01 DIAGNOSIS — Z113 Encounter for screening for infections with a predominantly sexual mode of transmission: Secondary | ICD-10-CM | POA: Diagnosis not present

## 2021-06-01 DIAGNOSIS — F17290 Nicotine dependence, other tobacco product, uncomplicated: Secondary | ICD-10-CM | POA: Insufficient documentation

## 2021-06-01 DIAGNOSIS — B3731 Acute candidiasis of vulva and vagina: Secondary | ICD-10-CM

## 2021-06-01 LAB — WET PREP, GENITAL
Clue Cells Wet Prep HPF POC: NONE SEEN
Sperm: NONE SEEN
Trich, Wet Prep: NONE SEEN

## 2021-06-01 LAB — URINALYSIS, ROUTINE W REFLEX MICROSCOPIC
Bilirubin Urine: NEGATIVE
Glucose, UA: NEGATIVE mg/dL
Hgb urine dipstick: NEGATIVE
Ketones, ur: NEGATIVE mg/dL
Leukocytes,Ua: NEGATIVE
Nitrite: NEGATIVE
Protein, ur: NEGATIVE mg/dL
Specific Gravity, Urine: 1.028 (ref 1.005–1.030)
pH: 5 (ref 5.0–8.0)

## 2021-06-01 LAB — POC URINE PREG, ED: Preg Test, Ur: NEGATIVE

## 2021-06-01 NOTE — ED Provider Notes (Signed)
[facility] Emergency Department Provider Note  ____________________________________________  Time seen: Approximately 10:57 PM  I have reviewed the triage vital signs and the nursing notes.   HISTORY  Chief Complaint Vaginal Itching    HPI Rachael Walsh is a 25 y.o. female who presents the emergency department complaining of vaginal itching and white discharge.  Patient has had symptoms for 2 days.  Patient denies any abdominal pain, nausea or vomiting, dysuria, polyuria, hematuria.  No medications for his complaint prior to arrival.       History reviewed. No pertinent past medical history.  There are no problems to display for this patient.   History reviewed. No pertinent surgical history.  Prior to Admission medications   Medication Sig Start Date End Date Taking? Authorizing Provider  fluconazole (DIFLUCAN) 150 MG tablet Take 1 tablet (150 mg total) by mouth once for 1 dose. 06/02/21 06/02/21 Yes Milia Warth, Delorise Royals, PA-C  aluminum-magnesium hydroxide-simethicone (MAALOX) 200-200-20 MG/5ML SUSP Take 30 mLs by mouth 4 (four) times daily -  before meals and at bedtime. 03/27/21   Sharman Cheek, MD  famotidine (PEPCID) 20 MG tablet Take 1 tablet (20 mg total) by mouth 2 (two) times daily. 03/27/21   Sharman Cheek, MD  ibuprofen (ADVIL) 600 MG tablet Take 1 tablet (600 mg total) by mouth every 6 (six) hours as needed. 09/04/20   Enid Derry, PA-C  magic mouthwash w/lidocaine SOLN Take 5 mLs by mouth 4 (four) times daily. 05/27/20   Leinani Lisbon, Delorise Royals, PA-C  methocarbamol (ROBAXIN) 500 MG tablet Take 1 tablet (500 mg total) by mouth every 6 (six) hours as needed for muscle spasms. 01/25/18   Tommi Rumps, PA-C  ondansetron (ZOFRAN ODT) 4 MG disintegrating tablet Take 1 tablet (4 mg total) by mouth every 8 (eight) hours as needed for nausea or vomiting. 03/27/21   Sharman Cheek, MD  potassium chloride (KLOR-CON) 10 MEQ tablet Take 1 tablet (10  mEq total) by mouth daily. 09/04/20   Enid Derry, PA-C  predniSONE (DELTASONE) 50 MG tablet Take one 50 mg tablet once daily for the next five days. 03/19/18   Orvil Feil, PA-C    Allergies Patient has no known allergies.  No family history on file.  Social History Social History   Tobacco Use   Smoking status: Every Day    Pack years: 0.00   Smokeless tobacco: Never  Vaping Use   Vaping Use: Every day  Substance Use Topics   Alcohol use: Yes   Drug use: Yes    Types: Marijuana     Review of Systems  Constitutional: No fever/chills Eyes: No visual changes. No discharge ENT: No upper respiratory complaints. Cardiovascular: no chest pain. Respiratory: no cough. No SOB. Gastrointestinal: No abdominal pain.  No nausea, no vomiting.  No diarrhea.  No constipation. Genitourinary: Negative for dysuria. No hematuria.  Positive for vaginal itching and discharge Musculoskeletal: Negative for musculoskeletal pain. Skin: Negative for rash, abrasions, lacerations, ecchymosis. Neurological: Negative for headaches, focal weakness or numbness.  10 System ROS otherwise negative.  ____________________________________________   PHYSICAL EXAM:  VITAL SIGNS: ED Triage Vitals  Enc Vitals Group     BP 06/01/21 2205 128/84     Pulse Rate 06/01/21 2205 (!) 57     Resp 06/01/21 2205 18     Temp 06/01/21 2205 98.3 F (36.8 C)     Temp Source 06/01/21 2205 Oral     SpO2 06/01/21 2205 100 %     Weight  06/01/21 2203 180 lb (81.6 kg)     Height 06/01/21 2203 5\' 2"  (1.575 m)     Head Circumference --      Peak Flow --      Pain Score 06/01/21 2203 0     Pain Loc --      Pain Edu? --      Excl. in GC? --      Constitutional: Alert and oriented. Well appearing and in no acute distress. Eyes: Conjunctivae are normal. PERRL. EOMI. Head: Atraumatic. ENT:      Ears:       Nose: No congestion/rhinnorhea.      Mouth/Throat: Mucous membranes are moist.  Neck: No stridor.    Cardiovascular: Normal rate, regular rhythm. Normal S1 and S2.  Good peripheral circulation. Respiratory: Normal respiratory effort without tachypnea or retractions. Lungs CTAB. Good air entry to the bases with no decreased or absent breath sounds. Gastrointestinal: Bowel sounds 4 quadrants. Soft and nontender to palpation. No guarding or rigidity. No palpable masses. No distention. No CVA tenderness. Genitourinary: No external lesions or chancres.  Exam with speculum revealed no vaginal lesions or tears.  Thick white discharge identified in the vaginal vault.  Cervix is visually unremarkable.  Bimanual exam revealed no cervical motion tenderness and no palpable lesions in the bilateral adnexa. Musculoskeletal: Full range of motion to all extremities. No gross deformities appreciated. Neurologic:  Normal speech and language. No gross focal neurologic deficits are appreciated.  Skin:  Skin is warm, dry and intact. No rash noted. Psychiatric: Mood and affect are normal. Speech and behavior are normal. Patient exhibits appropriate insight and judgement.  Exam chaperoned by female RN ____________________________________________   LABS (all labs ordered are listed, but only abnormal results are displayed)  Labs Reviewed  WET PREP, GENITAL - Abnormal; Notable for the following components:      Result Value   Yeast Wet Prep HPF POC PRESENT (*)    WBC, Wet Prep HPF POC RARE (*)    All other components within normal limits  URINALYSIS, ROUTINE W REFLEX MICROSCOPIC - Abnormal; Notable for the following components:   Color, Urine YELLOW (*)    APPearance CLEAR (*)    All other components within normal limits  CHLAMYDIA/NGC RT PCR ([facility] ONLY)            POC URINE PREG, ED   ____________________________________________  EKG   ____________________________________________  RADIOLOGY   No results found.  ____________________________________________    PROCEDURES  Procedure(s)  performed:    Procedures    Medications  fluconazole (DIFLUCAN) tablet 150 mg (has no administration in time range)     ____________________________________________   INITIAL IMPRESSION / ASSESSMENT AND PLAN / ED COURSE  Pertinent labs & imaging results that were available during my care of the patient were reviewed by me and considered in my medical decision making (see chart for details).  Review of the [place] CSRS was performed in accordance of the [facility] prior to dispensing any controlled drugs.           Patient's diagnosis is consistent with candidal vaginitis.  Patient presented to the emergency department with discharge and itching.  On physical exam patient did have a white discharge consistent with candidal infection.  This was confirmed with wet prep.  No other concerning findings on wet prep or gonorrhea chlamydia swab.  Patient will be treated with Diflucan here, if symptoms persist she will have 1 dose of Diflucan to take in a week.  Otherwise follow-up primary care.  Return precautions discussed with the patient..  Patient is given ED precautions to return to the ED for any worsening or new symptoms.     ____________________________________________  FINAL CLINICAL IMPRESSION(S) / ED DIAGNOSES  Final diagnoses:  Candidal vaginitis      NEW MEDICATIONS STARTED DURING THIS VISIT:  ED Discharge Orders          Ordered    fluconazole (DIFLUCAN) 150 MG tablet   Once        06/02/21 0101                This chart was dictated using voice recognition software/Dragon. Despite best efforts to proofread, errors can occur which can change the meaning. Any change was purely unintentional.    Racheal Patches, PA-C 06/02/21 0103    Merwyn Katos, MD 06/02/21 [phone]

## 2021-06-01 NOTE — ED Notes (Signed)
See triage note.

## 2021-06-01 NOTE — ED Notes (Addendum)
Patient prepared for pelvic exam. Patient reports itchy, clumpy, white discharge from vagina.

## 2021-06-01 NOTE — ED Triage Notes (Signed)
Pt states she has had vaginal itching for the past 3 days, denies burning with urination or frequency. Pt states she has "clumpy white" discharge.

## 2021-06-02 LAB — CHLAMYDIA/NGC RT PCR (ARMC ONLY)
Chlamydia Tr: NOT DETECTED
N gonorrhoeae: NOT DETECTED

## 2021-06-02 MED ORDER — FLUCONAZOLE 50 MG PO TABS
150.0000 mg | ORAL_TABLET | Freq: Once | ORAL | Status: AC
  Administered 2021-06-02: 01:00:00 150 mg via ORAL
  Filled 2021-06-02: qty 1

## 2021-06-02 MED ORDER — FLUCONAZOLE 150 MG PO TABS: 150.0000 mg | ORAL_TABLET | Freq: Once | ORAL | 0 refills | Status: AC

## 2021-06-02 NOTE — ED Notes (Signed)
Patient is resting comfortably. 

## 2022-01-24 ENCOUNTER — Other Ambulatory Visit: Payer: Self-pay

## 2022-01-24 ENCOUNTER — Encounter (HOSPITAL_COMMUNITY): Payer: Self-pay | Admitting: Emergency Medicine

## 2022-01-24 ENCOUNTER — Emergency Department (HOSPITAL_COMMUNITY)
Admission: EM | Admit: 2022-01-24 | Discharge: 2022-01-25 | Disposition: A | Discharge status: Home / Self Care | Payer: Managed Care, Other (non HMO) | Attending: Emergency Medicine | Admitting: Emergency Medicine

## 2022-01-24 DIAGNOSIS — Y92002 Bathroom of unspecified non-institutional (private) residence single-family (private) house as the place of occurrence of the external cause: Secondary | ICD-10-CM | POA: Diagnosis not present

## 2022-01-24 DIAGNOSIS — Z23 Encounter for immunization: Secondary | ICD-10-CM | POA: Diagnosis not present

## 2022-01-24 DIAGNOSIS — W19XXXA Unspecified fall, initial encounter: Secondary | ICD-10-CM

## 2022-01-24 DIAGNOSIS — W01198A Fall on same level from slipping, tripping and stumbling with subsequent striking against other object, initial encounter: Secondary | ICD-10-CM | POA: Insufficient documentation

## 2022-01-24 DIAGNOSIS — S0181XA Laceration without foreign body of other part of head, initial encounter: Secondary | ICD-10-CM | POA: Insufficient documentation

## 2022-01-24 DIAGNOSIS — S0993XA Unspecified injury of face, initial encounter: Secondary | ICD-10-CM | POA: Diagnosis present

## 2022-01-24 MED ORDER — IBUPROFEN 400 MG PO TABS
400.0000 mg | ORAL_TABLET | Freq: Once | ORAL | Status: AC
  Administered 2022-01-24: 400 mg via ORAL
  Filled 2022-01-24: qty 1

## 2022-01-24 MED ORDER — TETANUS-DIPHTH-ACELL PERTUSSIS 5-2.5-18.5 LF-MCG/0.5 IM SUSY
0.5000 mL | PREFILLED_SYRINGE | Freq: Once | INTRAMUSCULAR | Status: AC
  Administered 2022-01-24: 0.5 mL via INTRAMUSCULAR
  Filled 2022-01-24: qty 0.5

## 2022-01-24 NOTE — ED Triage Notes (Signed)
Patient tripped and hit her head against the bathroom counter this evening at home , denies LOC , presents with skin laceration at right upper forehead approx. 1/2" with minimal bleeding .

## 2022-01-24 NOTE — ED Provider Triage Note (Signed)
Emergency Medicine Provider Triage Evaluation Note  Rachael Walsh , a 26 y.o. female  was evaluated in triage.  Pt complains of fall PTA. Alleges to have been cleaning in her bathroom and tripping on an unknown object causing her to fall into the sink. Laceration to the R upper forehead. Bleeding controlled. Not anticoagulated. C/o mild headache. No medications PTA. Denies LOC, N/V.  Review of Systems  Positive: As above Negative: As above  Physical Exam  BP 131/85    Pulse (!) 121    Temp 98.3 F (36.8 C) (Oral)    Resp 18    Ht 5\' 2"  (1.575 m)    Wt 105 kg    LMP 12/28/2021    SpO2 99%    BMI 42.34 kg/m  Gen:   Awake, no distress   Resp:  Normal effort  MSK:   Moves extremities without difficulty  Other:  2cm laceration to R upper forehead near hair line. Bleeding controlled. Superficial abrasions to face. No hemotympanum b/l. PERRL.  Medical Decision Making  Medically screening exam initiated at 11:08 PM.  Appropriate orders placed.  Ekta Wolaver was informed that the remainder of the evaluation will be completed by another provider, this initial triage assessment does not replace that evaluation, and the importance of remaining in the ED until their evaluation is complete.  Forehead laceration s/p mechanical fall   Dan Humphreys, Antony Madura 01/24/22 2310

## 2022-01-25 ENCOUNTER — Emergency Department (HOSPITAL_COMMUNITY): Payer: Managed Care, Other (non HMO)

## 2022-01-25 MED ORDER — LIDOCAINE-EPINEPHRINE (PF) 2 %-1:200000 IJ SOLN
10.0000 mL | Freq: Once | INTRAMUSCULAR | Status: AC
  Administered 2022-01-25: 10 mL
  Filled 2022-01-25: qty 20

## 2022-01-25 NOTE — Discharge Instructions (Addendum)
Avoid soaking your wound in stagnant or dirty water such as while taking a bath. You can shower normally. Keep the area clean with mild soap and warm water. Do not apply peroxide or alcohol to your wound as this can break down newly forming skin and prolong wound healing. If you keep the area bandaged, change the dressing/bandage at least once per day. Have your staples/sutures removed in 7 days. °

## 2022-01-25 NOTE — ED Provider Notes (Signed)
[facility] Provider Note   CSN: [id number] Arrival date & time: 01/24/22  2300     History  Chief Complaint  Patient presents with   Head Injury ( Laceration)     Rachael Walsh is a 26 y.o. female.  26 y/o female presents to the ED with complaints of fall PTA. Alleges to have been cleaning in her bathroom and tripping on an unknown object causing her to fall into the sink. Laceration to the R upper forehead. Bleeding controlled. Not anticoagulated. C/o mild headache. No medications PTA. Denies LOC, N/V.  The history is provided by the patient. No language interpreter was used.      Home Medications Prior to Admission medications   Medication Sig Start Date End Date Taking? Authorizing Provider  aluminum-magnesium hydroxide-simethicone (MAALOX) 200-200-20 MG/5ML SUSP Take 30 mLs by mouth 4 (four) times daily -  before meals and at bedtime. 03/27/21   Sharman CheekStafford, Phillip, MD  famotidine (PEPCID) 20 MG tablet Take 1 tablet (20 mg total) by mouth 2 (two) times daily. 03/27/21   Sharman CheekStafford, Phillip, MD  ibuprofen (ADVIL) 600 MG tablet Take 1 tablet (600 mg total) by mouth every 6 (six) hours as needed. 09/04/20   Enid DerryWagner, Ashley, PA-C  magic mouthwash w/lidocaine SOLN Take 5 mLs by mouth 4 (four) times daily. 05/27/20   Cuthriell, Delorise RoyalsJonathan D, PA-C  methocarbamol (ROBAXIN) 500 MG tablet Take 1 tablet (500 mg total) by mouth every 6 (six) hours as needed for muscle spasms. 01/25/18   Tommi RumpsSummers, Rhonda L, PA-C  ondansetron (ZOFRAN ODT) 4 MG disintegrating tablet Take 1 tablet (4 mg total) by mouth every 8 (eight) hours as needed for nausea or vomiting. 03/27/21   Sharman CheekStafford, Phillip, MD  potassium chloride (KLOR-CON) 10 MEQ tablet Take 1 tablet (10 mEq total) by mouth daily. 09/04/20   Enid DerryWagner, Ashley, PA-C  predniSONE (DELTASONE) 50 MG tablet Take one 50 mg tablet once daily for the next five days. 03/19/18   Orvil FeilWoods, Jaclyn M, PA-C      Allergies    Patient has no known  allergies.    Review of Systems   Review of Systems Ten systems reviewed and are negative for acute change, except as noted in the HPI.    Physical Exam Updated Vital Signs BP 116/63    Pulse 78    Temp 98.3 F (36.8 C) (Oral)    Resp 18    Ht 5\' 2"  (1.575 m)    Wt 105 kg    LMP 12/28/2021    SpO2 100%    BMI 42.34 kg/m   Physical Exam Vitals and nursing note reviewed.  Constitutional:      General: She is not in acute distress.    Appearance: Normal appearance. She is well-developed. She is not diaphoretic.     Comments: Nontoxic appearing  HENT:     Head: Normocephalic.     Comments: 2cm laceration to R upper forehead near hair line. Bleeding controlled. Superficial abrasions to face.    Right Ear: Tympanic membrane, ear canal and external ear normal.     Left Ear: Tympanic membrane, ear canal and external ear normal.     Ears:     Comments: No hemotympanum b/l Eyes:     General: No scleral icterus.    Conjunctiva/sclera: Conjunctivae normal.  Cardiovascular:     Rate and Rhythm: Normal rate and regular rhythm.     Pulses: Normal pulses.  Pulmonary:     Effort: Pulmonary  effort is normal. No respiratory distress.     Comments: Respirations even and unlabored Musculoskeletal:        General: Normal range of motion.     Cervical back: Normal range of motion.  Skin:    General: Skin is warm and dry.     Coloration: Skin is not pale.     Findings: No erythema or rash.  Neurological:     Mental Status: She is alert and oriented to person, place, and time.     Coordination: Coordination normal.     Comments: Ambulator with steady gait.  Psychiatric:        Behavior: Behavior normal.    ED Results / Procedures / Treatments   Labs (all labs ordered are listed, but only abnormal results are displayed) Labs Reviewed - No data to display  EKG None  Radiology CT HEAD WO CONTRAST ( )  Result Date: 01/25/2022 CLINICAL DATA:  Head trauma, moderate-severe fall.  Fall  EXAM: CT HEAD WITHOUT CONTRAST TECHNIQUE: Contiguous axial images were obtained from the base of the skull through the vertex without intravenous contrast. RADIATION DOSE REDUCTION: This exam was performed according to the departmental dose-optimization program which includes automated exposure control, adjustment of the mA and/or kV according to patient size and/or use of iterative reconstruction technique. COMPARISON:  None. FINDINGS: Brain: No acute intracranial abnormality. Specifically, no hemorrhage, hydrocephalus, mass lesion, acute infarction, or significant intracranial injury. Vascular: No hyperdense vessel or unexpected calcification. Skull: No acute calvarial abnormality. Sinuses/Orbits: No acute findings Other: None IMPRESSION: No acute intracranial abnormality. Electronically Signed   By: Charlett Nose M.D.   On: 01/25/2022 01:05    Procedures .Marland KitchenLaceration Repair  Date/Time: 01/25/2022 3:14 AM Performed by: Antony Madura, PA-C Authorized by: Antony Madura, PA-C   Consent:    Consent obtained:  Verbal and emergent situation   Consent given by:  Patient   Risks, benefits, and alternatives were discussed: yes     Risks discussed:  Pain, poor cosmetic result, poor wound healing and need for additional repair   Alternatives discussed:  No treatment Universal protocol:    Imaging studies available: yes     Required blood products, implants, devices, and special equipment available: yes     Site/side marked: yes     Immediately prior to procedure, a time out was called: yes     Patient identity confirmed:  Verbally with patient and arm band Anesthesia:    Anesthesia method:  Local infiltration   Local anesthetic:  Lidocaine 2% WITH epi Laceration details:    Location:  Scalp   Scalp location:  Frontal   Length (cm):  2 Pre-procedure details:    Preparation:  Patient was prepped and draped in usual sterile fashion Exploration:    Hemostasis achieved with:  Direct pressure   Imaging  outcome: foreign body not noted     Wound exploration: wound explored through full range of motion and entire depth of wound visualized   Treatment:    Area cleansed with:  Povidone-iodine   Amount of cleaning:  Standard   Irrigation solution:  Tap water   Irrigation method:  Tap   Debridement:  None Skin repair:    Repair method:  Sutures   Suture size:  5-0   Suture material:  Prolene   Suture technique:  Horizontal mattress   Number of sutures:  1 Approximation:    Approximation:  Close Repair type:    Repair type:  Intermediate Post-procedure details:  Dressing:  Adhesive bandage   Procedure completion:  Tolerated well, no immediate complications    Medications Ordered in ED Medications  Tdap (BOOSTRIX) injection 0.5 mL (0.5 mLs Intramuscular Given 01/24/22 2321)  ibuprofen (ADVIL) tablet 400 mg (400 mg Oral Given 01/24/22 2321)  lidocaine-EPINEPHrine (XYLOCAINE W/EPI) 2 %-1:200000 (PF) injection 10 mL (10 mLs Infiltration Given 01/25/22 0255)    ED Course/ Medical Decision Making/ A&P                           Medical Decision Making Amount and/or Complexity of Data Reviewed Radiology: ordered.  Risk Prescription drug management.   26 y/o with forehead laceration s/p mechanical fall at home. No LOC or concussive symptoms; denies N/V, extremity numbness or weakness. Neurologic exam is nonfocal. Head CT obtained and reviewed. Negative for acute intracranial injury; agree with radiologist interpretation.   Tdap booster given. Laceration occurred < 8 hours prior to repair which was well tolerated. Patient has no comorbidities to effect normal wound healing. Discussed suture home care with pt and answered questions. Pt to follow up for wound check and suture removal in 7 days. Patient with no complaints prior to discharge; all questions answered.  Departed ED in stable condition.           Final Clinical Impression(s) / ED Diagnoses Final diagnoses:  Forehead  laceration, initial encounter    Rx / DC Orders ED Discharge Orders     None         Antony Madura, PA-C 01/25/22 0318    Dione Booze, MD 01/25/22 [phone]

## 2022-02-19 IMAGING — CT CT HEAD W/O CM
4 series · 15 of 47 positions shown, 17 images · non-contrast
Comparison: None.

CLINICAL DATA: Head trauma, moderate-severe fall.  Fall



[Series 3: head without · axial · non-contrast · 0.43mm/px · z∈[-57,+58]mm · 7 of 31 slices shown, 9 images]
[im 4/31  brain]
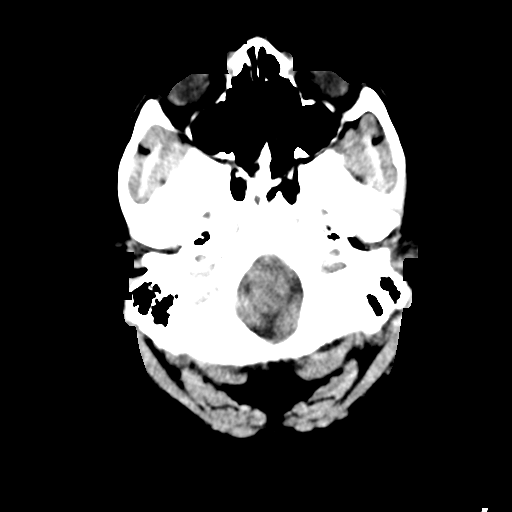
[im 4/31  bone]
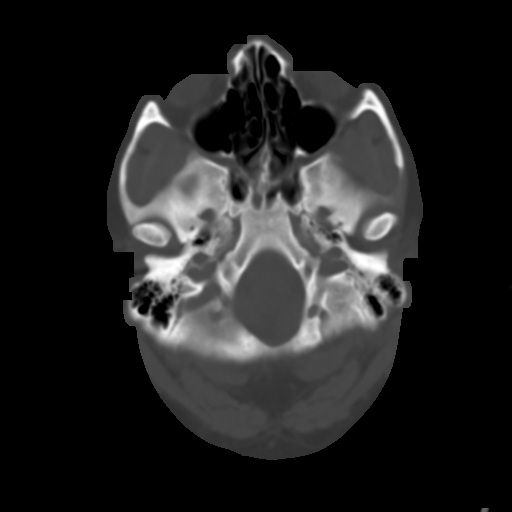
[im 8/31  brain]
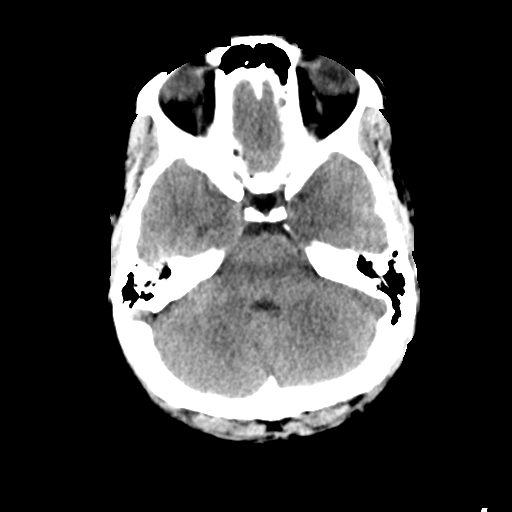
[im 12/31  brain]
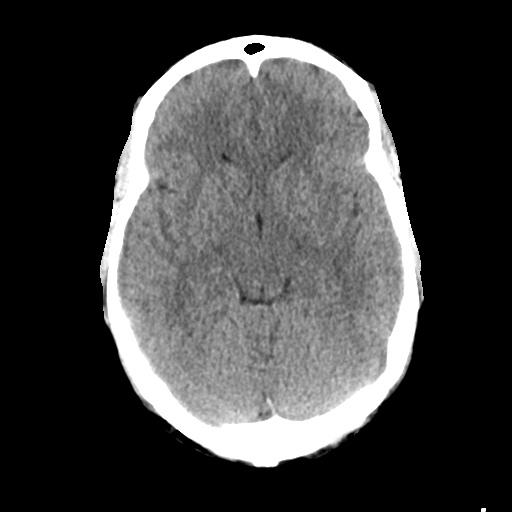
[im 16/31  brain]
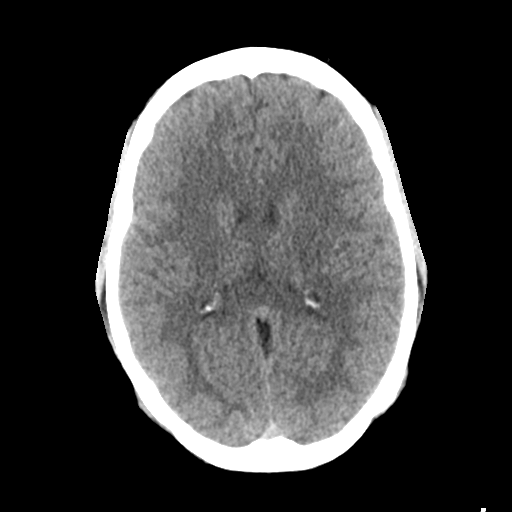
[im 19/31  brain]
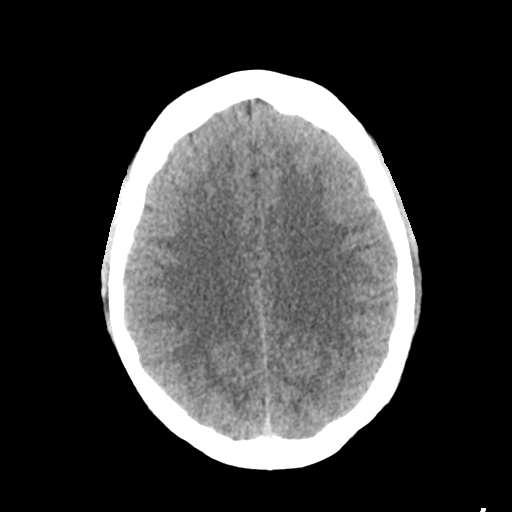
[im 19/31  bone]
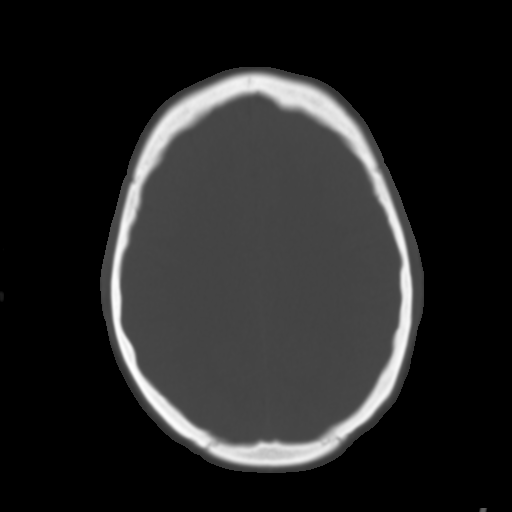
[im 23/31  brain]
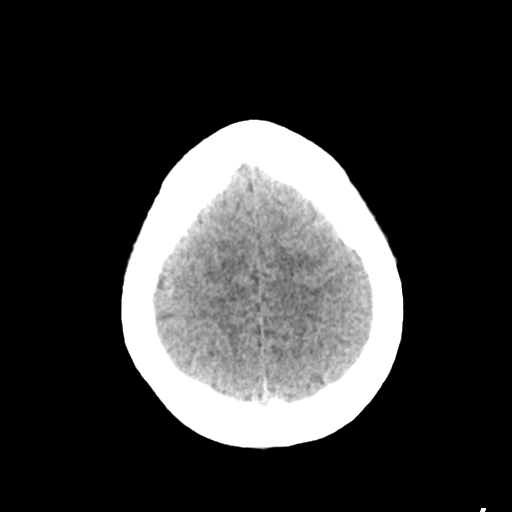
[im 27/31  brain]
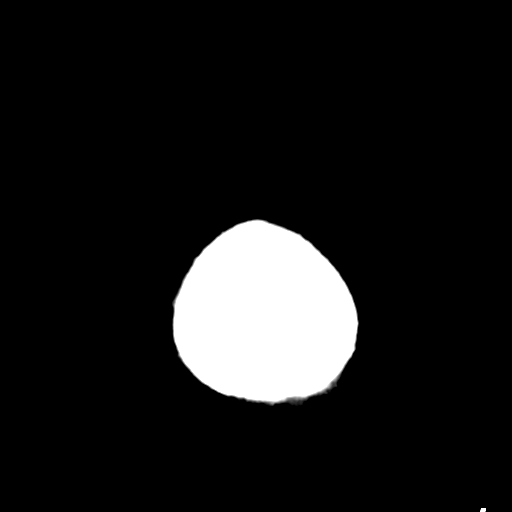

[Series 4: head bone · axial · 0.43mm/px · z∈[-58,-42]mm · 2 of 77 slices shown]
[im 8/77  bone]
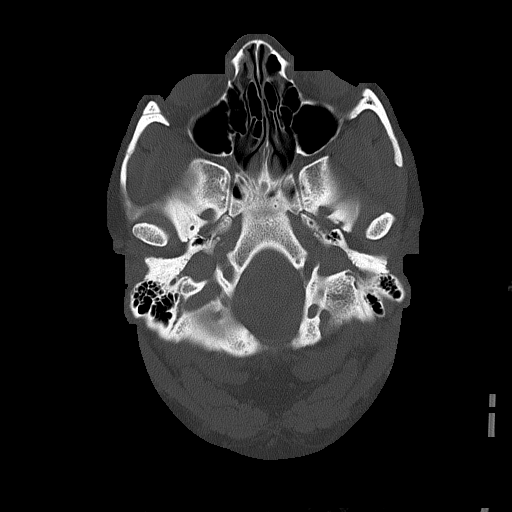
[im 16/77  bone]
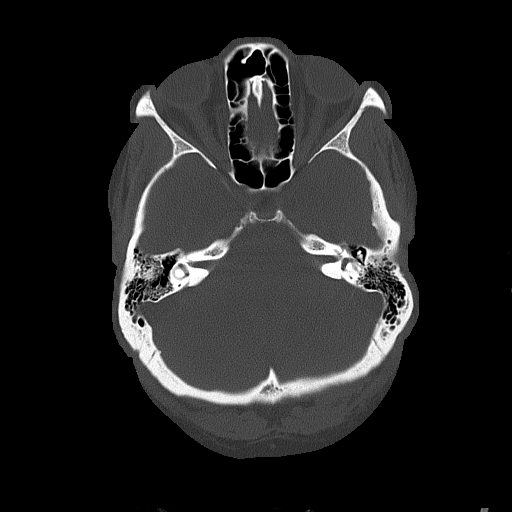

[Series 5: head without cor · coronal · non-contrast · 0.29mm/px · 3 of 67 slices shown]
[im 23/67  brain]
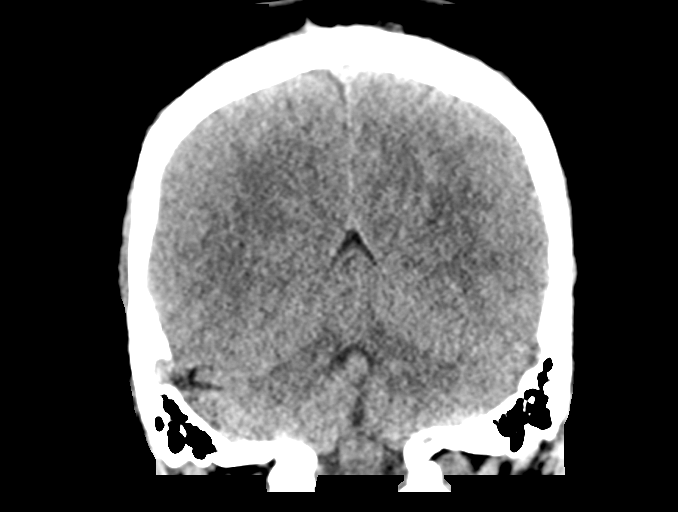
[im 30/67  brain]
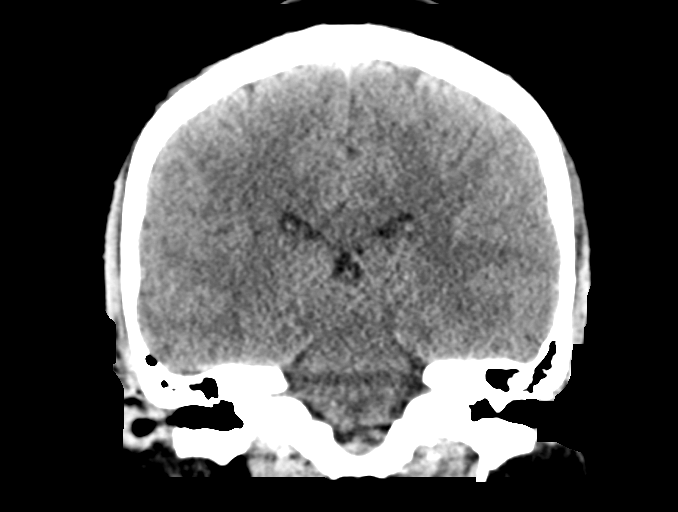
[im 37/67  brain]
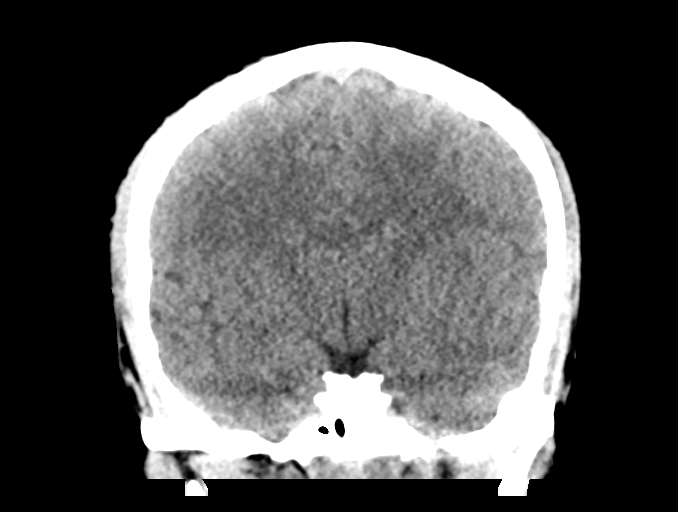

[Series 6: head without sag · sagittal · non-contrast · 0.31mm/px · 3 of 67 slices shown]
[im 23/67  brain]
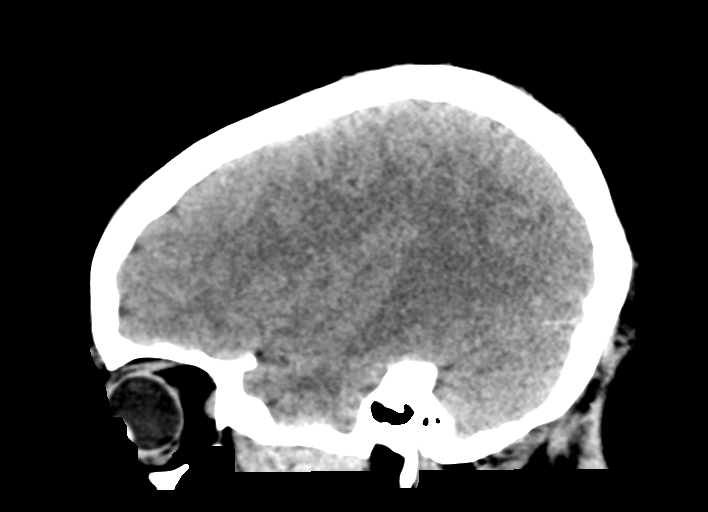
[im 34/67  brain]
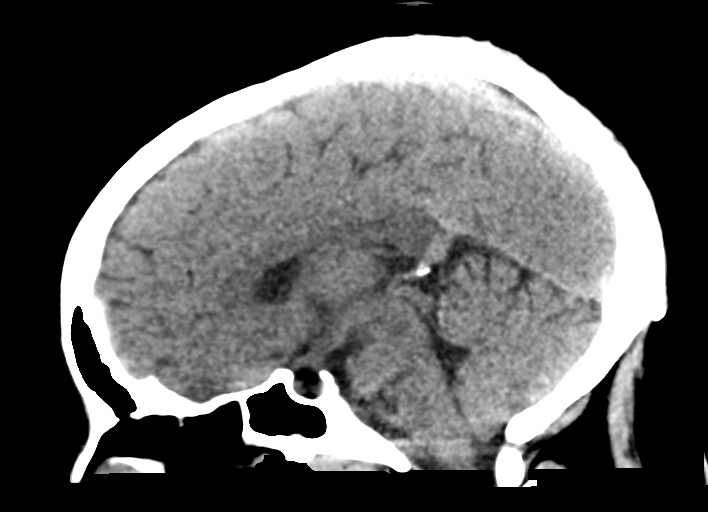
[im 45/67  brain]
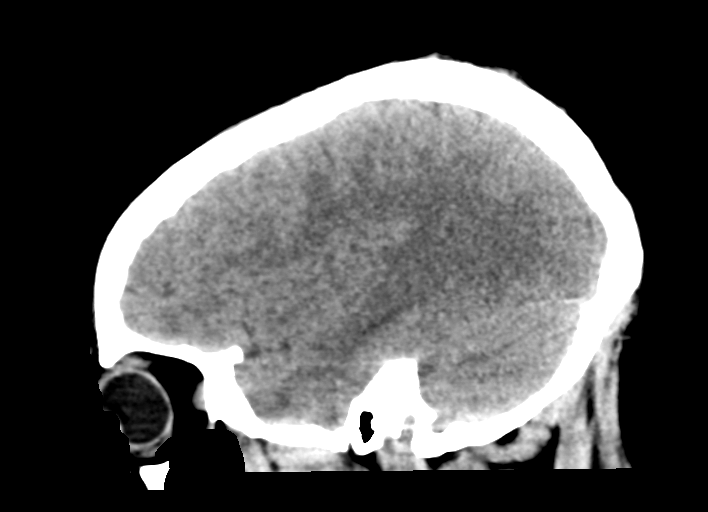

[15 of 47 positions shown; findings below may reference images not displayed]

FINDINGS: Brain: No acute intracranial abnormality. Specifically, no
hemorrhage, hydrocephalus, mass lesion, acute infarction, or
significant intracranial injury.

Vascular: No hyperdense vessel or unexpected calcification.

Skull: No acute calvarial abnormality.

Sinuses/Orbits: No acute findings

Other: None
IMPRESSION: No acute intracranial abnormality.

## 2022-04-18 ENCOUNTER — Emergency Department
Admission: EM | Admit: 2022-04-18 | Discharge: 2022-04-18 | Disposition: A | Discharge status: Home / Self Care | Payer: Managed Care, Other (non HMO) | Attending: Emergency Medicine | Admitting: Emergency Medicine

## 2022-04-18 ENCOUNTER — Other Ambulatory Visit: Payer: Self-pay

## 2022-04-18 ENCOUNTER — Emergency Department: Payer: Managed Care, Other (non HMO)

## 2022-04-18 DIAGNOSIS — R2232 Localized swelling, mass and lump, left upper limb: Secondary | ICD-10-CM | POA: Diagnosis not present

## 2022-04-18 DIAGNOSIS — X58XXXA Exposure to other specified factors, initial encounter: Secondary | ICD-10-CM | POA: Diagnosis not present

## 2022-04-18 DIAGNOSIS — S59912A Unspecified injury of left forearm, initial encounter: Secondary | ICD-10-CM | POA: Diagnosis present

## 2022-04-18 DIAGNOSIS — R58 Hemorrhage, not elsewhere classified: Secondary | ICD-10-CM

## 2022-04-18 DIAGNOSIS — S5012XA Contusion of left forearm, initial encounter: Secondary | ICD-10-CM | POA: Diagnosis not present

## 2022-04-18 NOTE — ED Triage Notes (Signed)
Pt states he gave plasma on Wednesday and and then on Thursday started noticing bruising in her arm all the way down and got worse ?

## 2022-04-18 NOTE — ED Provider Notes (Signed)
? ?[facility] ?Provider Note ? ?Patient Contact: 6:53 PM (approximate) ? ? ?History  ? ?Bleeding/Bruising ? ? ?HPI ? ?Rachael Walsh is a 26 y.o. female who presents the emergency department complaining of bruising, pain to the left upper extremity.  Patient donates plasma, states during her infusion, she does not feel like the IV was in place, it appears that extravasated.  She had some mild ecchymosis and edema to the area initially but this is spreading down her left forearm.  It is tender to touch.  No fevers or chills.  No history of bleeding or clotting disorders.  Patient states that she has donated plasma before with no difficulties. ?  ? ? ?Physical Exam  ? ?Triage Vital Signs: ?ED Triage Vitals  ?Enc Vitals Group  ?   BP 04/18/22 1614 136/80  ?   Pulse Rate 04/18/22 1614 77  ?   Resp 04/18/22 1614 20  ?   Temp 04/18/22 1616 98.3 ?F (36.8 ?C)  ?   Temp Source 04/18/22 1616 Oral  ?   SpO2 04/18/22 1614 100 %  ?   Weight 04/18/22 1615 197 lb (89.4 kg)  ?   Height 04/18/22 1615 5\' 2"  (1.575 m)  ?   Head Circumference --   ?   Peak Flow --   ?   Pain Score 04/18/22 1615 5  ?   Pain Loc --   ?   Pain Edu? --   ?   Excl. in [place]? --   ? ? ?Most recent vital signs: ?Vitals:  ? 04/18/22 1614 04/18/22 1616  ?BP: 136/80   ?Pulse: 77   ?Resp: 20   ?Temp:  98.3 ?F (36.8 ?C)  ?SpO2: 100%   ? ? ? ?General: Alert and in no acute distress. ?Cardiovascular:  Good peripheral perfusion ?Respiratory: Normal respiratory effort without tachypnea or retractions. Lungs CTAB.  ?Musculoskeletal: Full range of motion to all extremities.  Examination of the left upper extremity reveals mild edema and ecchymosis along the AC fossa extending into the forearm.  There is no erythema, warmth to palpation.  Full range of motion to the elbow joint and wrist.  Radial pulses sensation intact distally. ?Neurologic:  No gross focal neurologic deficits are appreciated.  ?Skin:   No rash noted ?Other: ? ? ?ED Results /  Procedures / Treatments  ? ?Labs ?(all labs ordered are listed, but only abnormal results are displayed) ?Labs Reviewed - No data to display ? ? ?EKG ? ? ? ? ?RADIOLOGY ? ?I personally viewed and evaluated these images as part of my medical decision making, as well as reviewing the written report by the radiologist. ? ?ED Provider Interpretation: No evidence of DVT or other significant concern ?Ultrasound of the left upper extremity ? ?04/20/22 Venous Img Upper Uni Left ? ?Result Date: 04/18/2022 ?CLINICAL DATA:  Pain and swelling EXAM: Left UPPER EXTREMITY VENOUS DOPPLER ULTRASOUND TECHNIQUE: Gray-scale sonography with graded compression, as well as color Doppler and duplex ultrasound were performed to evaluate the upper extremity deep venous system from the level of the subclavian vein and including the jugular, axillary, basilic, radial, ulnar and upper cephalic vein. Spectral Doppler was utilized to evaluate flow at rest and with distal augmentation maneuvers. COMPARISON:  None. FINDINGS: Contralateral Subclavian Vein: Respiratory phasicity is normal and symmetric with the symptomatic side. No evidence of thrombus. Normal compressibility. Internal Jugular Vein: No evidence of thrombus. Normal compressibility, respiratory phasicity and response to augmentation. Subclavian Vein: No evidence of  thrombus. Normal compressibility, respiratory phasicity and response to augmentation. Axillary Vein: No evidence of thrombus. Normal compressibility, respiratory phasicity and response to augmentation. Cephalic Vein: No evidence of thrombus. Normal compressibility, respiratory phasicity and response to augmentation. Basilic Vein: No evidence of thrombus. Normal compressibility, respiratory phasicity and response to augmentation. Brachial Veins: No evidence of thrombus. Normal compressibility, respiratory phasicity and response to augmentation. Radial Veins: No evidence of thrombus. Normal compressibility, respiratory phasicity and  response to augmentation. Ulnar Veins: No evidence of thrombus. Normal compressibility, respiratory phasicity and response to augmentation. Venous Reflux:  None visualized. Other Findings:  None visualized. IMPRESSION: No evidence of DVT within the left upper extremity. Electronically Signed   By: Ernie Avena M.D.   On: 04/18/2022 20:10   ? ?PROCEDURES: ? ?Critical Care performed: No ? ?Procedures ? ? ?MEDICATIONS ORDERED IN ED: ?Medications - No data to display ? ? ?IMPRESSION / MDM / ASSESSMENT AND PLAN / ED COURSE  ?I reviewed the triage vital signs and the nursing notes. ?             ?               ? ?Differential diagnosis includes, but is not limited to, ecchymosis, extravasation, hemorrhage, DVT, phlebitis ? ? ?Patient's diagnosis is consistent with ecchymosis of the forearm.  Patient presents the emergency department bruising, pain and swelling to the left forearm after having an IV for her plasma donation.  Patient likely had an extravasation during this procedure.  Patient with edema, pain in the forearm so imaging was obtained to ensure no evidence of phlebitis or DVT.  Ultrasound is reassuring.  Care instructions are discussed with the patient.  Follow-up with primary care as needed..  Patient is given ED precautions to return to the ED for any worsening or new symptoms. ? ? ? ?  ? ? ?FINAL CLINICAL IMPRESSION(S) / ED DIAGNOSES  ? ?Final diagnoses:  ?Ecchymosis of forearm  ? ? ? ?Rx / DC Orders  ? ?ED Discharge Orders   ? ? None  ? ?  ? ? ? ?Note:  This document was prepared using Dragon voice recognition software and may include unintentional dictation errors. ?  ?Racheal Patches, PA-C ?04/18/22 2043 ? ?  ?Shaune Pollack, MD ?04/19/22 1553 ? ?

## 2022-05-13 IMAGING — US US EXTREM  UP VENOUS*L*
1 series · 13 of 24 positions shown · non-contrast
Comparison: None.

CLINICAL DATA: Pain and swelling



[Series 1: us venous img upper uni left (dvt) · portal-venous · 13 of 31 slices shown]
[im 1/31]
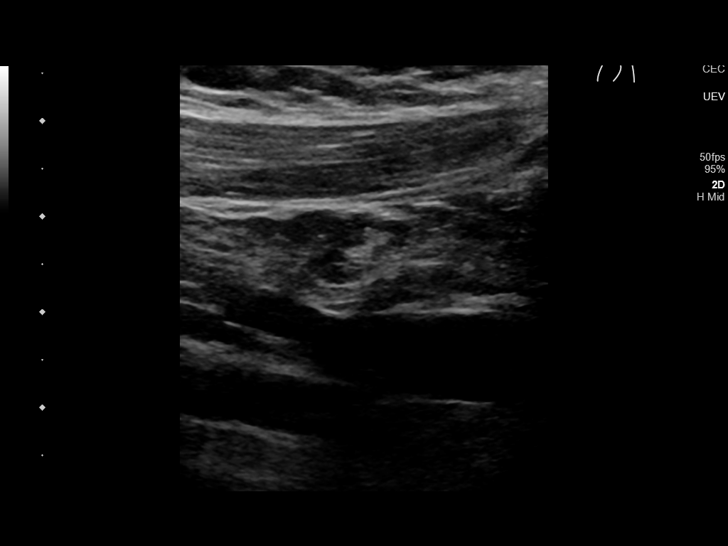
[im 3/31]
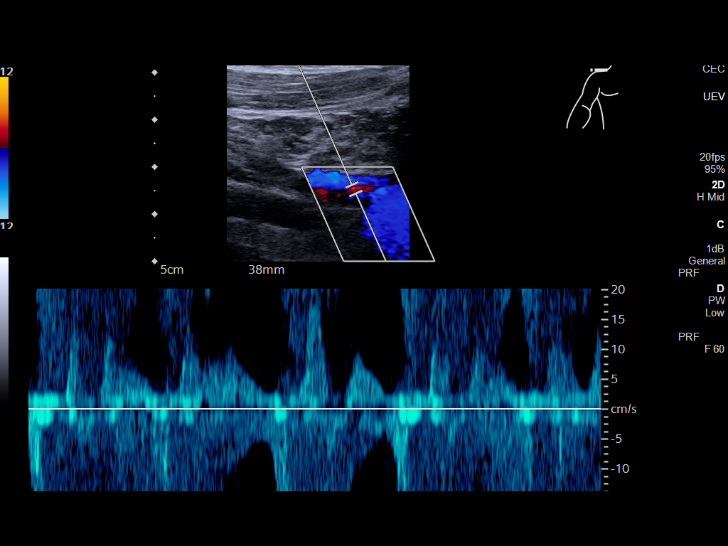
[im 6/31]
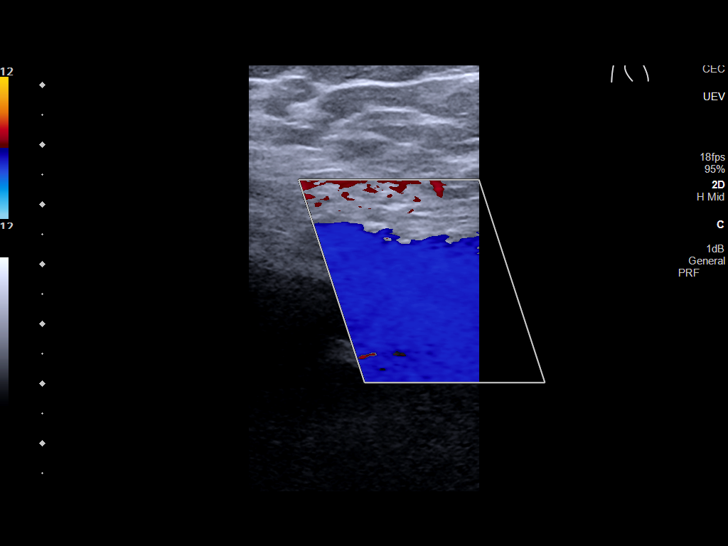
[im 8/31]
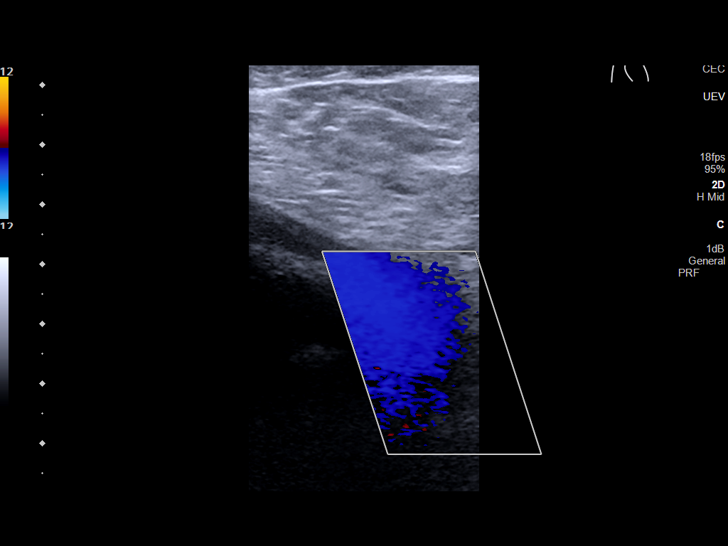
[im 11/31]
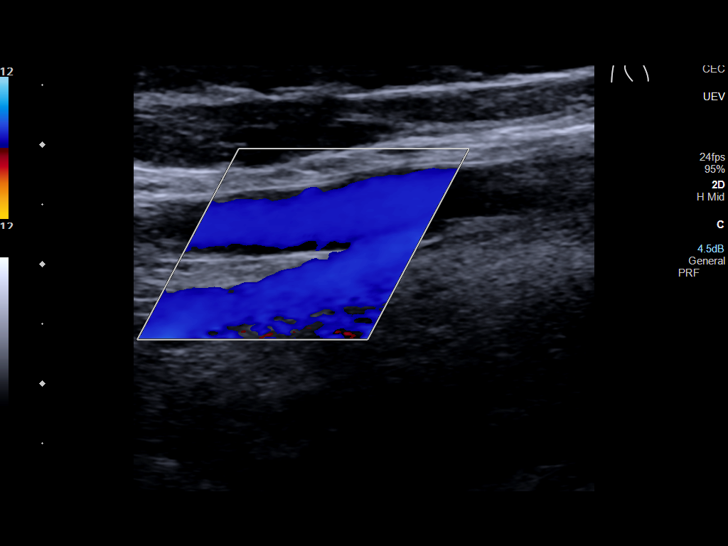
[im 14/31]
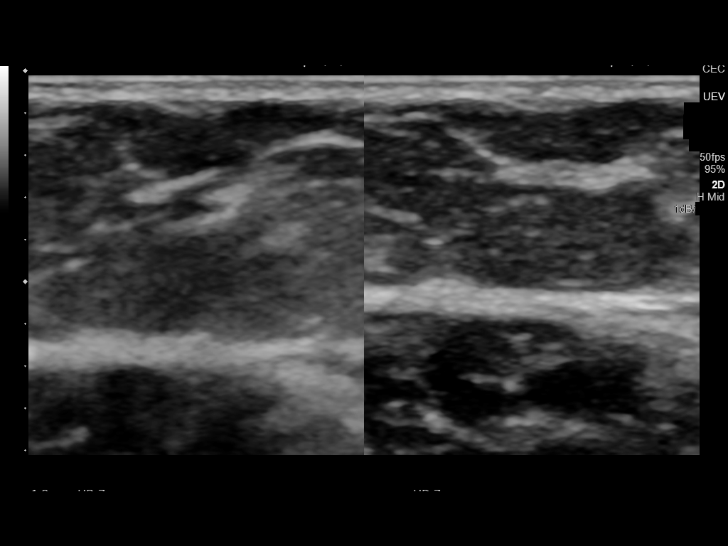
[im 16/31]
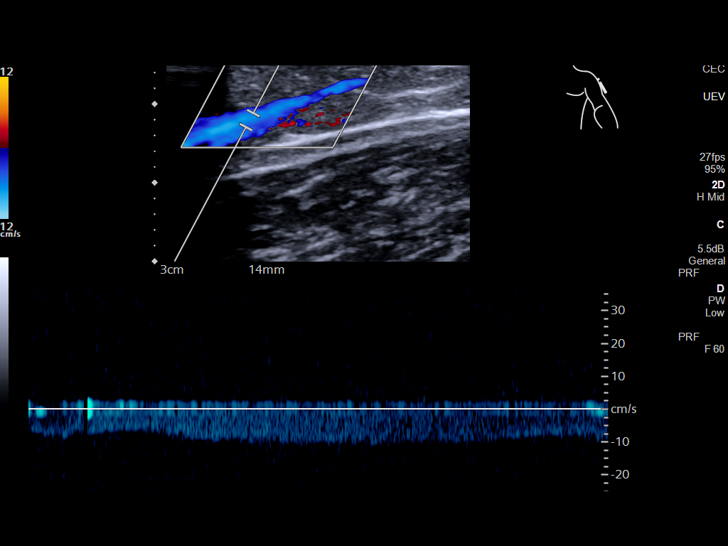
[im 17/31]
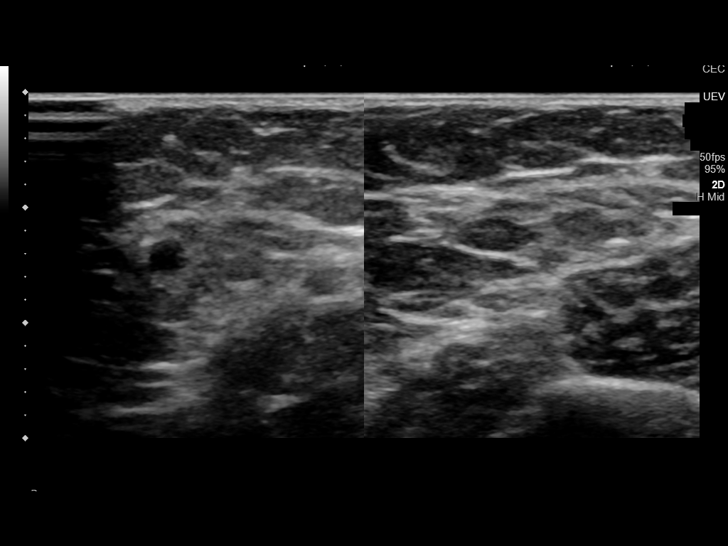
[im 20/31]
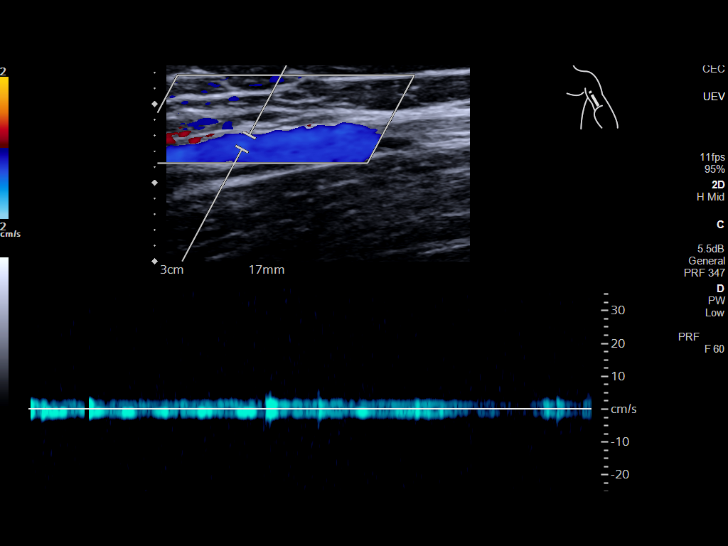
[im 23/31]
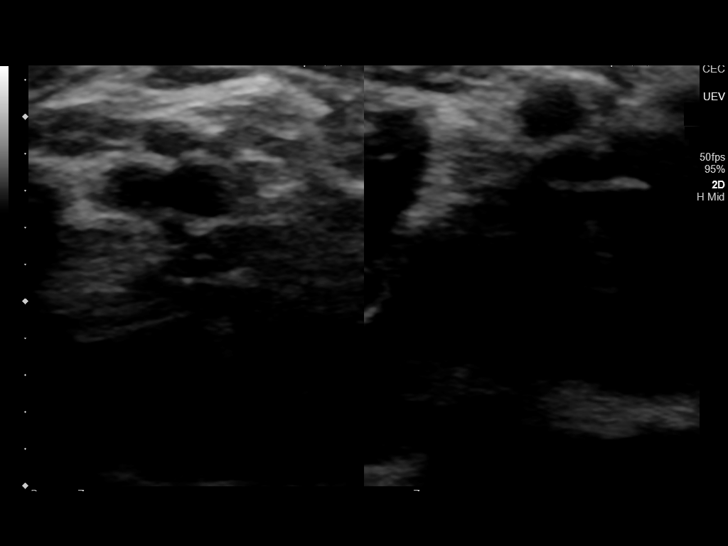
[im 25/31]
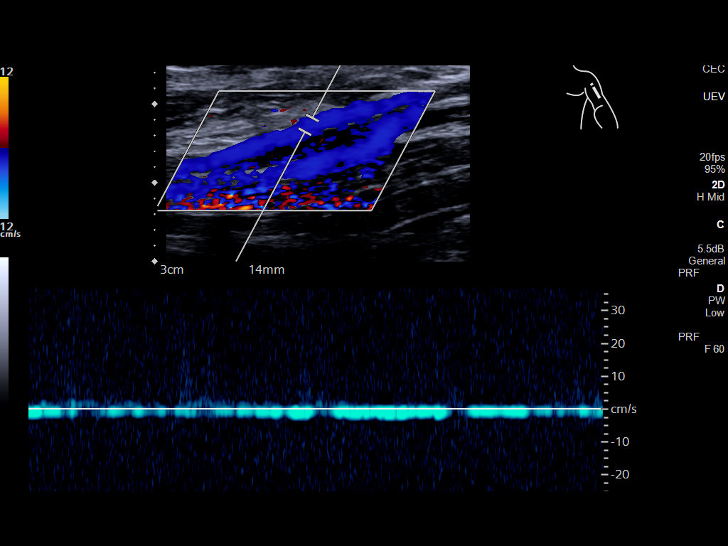
[im 28/31]
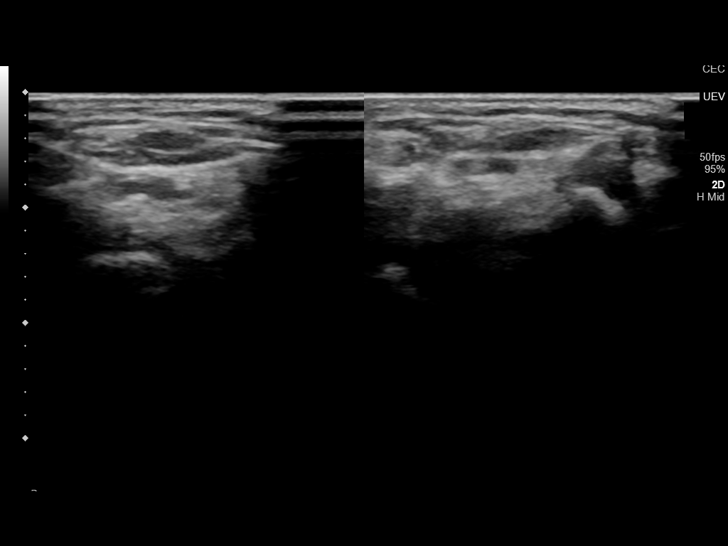
[im 31/31]
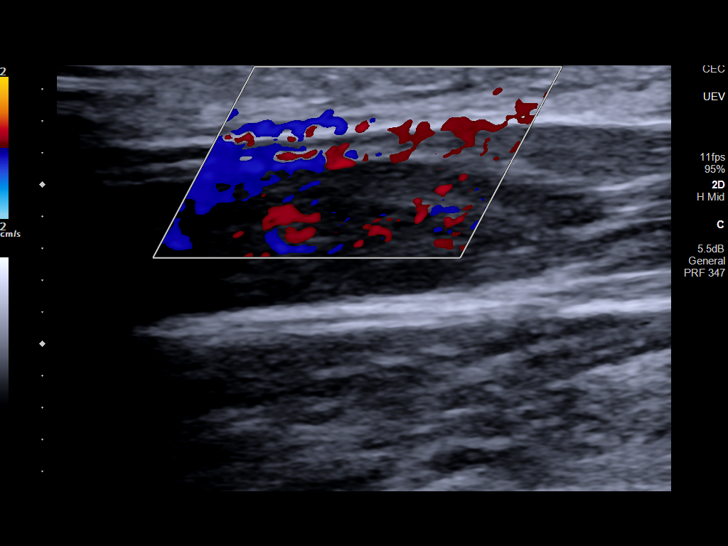

[13 of 24 positions shown; findings below may reference images not displayed]

FINDINGS: Contralateral Subclavian Vein: Respiratory phasicity is normal and
symmetric with the symptomatic side. No evidence of thrombus. Normal
compressibility.

Internal Jugular Vein: No evidence of thrombus. Normal
compressibility, respiratory phasicity and response to augmentation.

Subclavian Vein: No evidence of thrombus. Normal compressibility,
respiratory phasicity and response to augmentation.

Axillary Vein: No evidence of thrombus. Normal compressibility,
respiratory phasicity and response to augmentation.

Cephalic Vein: No evidence of thrombus. Normal compressibility,
respiratory phasicity and response to augmentation.

Basilic Vein: No evidence of thrombus. Normal compressibility,
respiratory phasicity and response to augmentation.

Brachial Veins: No evidence of thrombus. Normal compressibility,
respiratory phasicity and response to augmentation.

Radial Veins: No evidence of thrombus. Normal compressibility,
respiratory phasicity and response to augmentation.

Ulnar Veins: No evidence of thrombus. Normal compressibility,
respiratory phasicity and response to augmentation.

Venous Reflux:  None visualized.

Other Findings:  None visualized.
IMPRESSION: No evidence of DVT within the left upper extremity.

## 2022-05-27 ENCOUNTER — Other Ambulatory Visit: Payer: Self-pay

## 2022-05-27 ENCOUNTER — Encounter: Payer: Self-pay | Admitting: Emergency Medicine

## 2022-05-27 ENCOUNTER — Emergency Department: Payer: Managed Care, Other (non HMO)

## 2022-05-27 ENCOUNTER — Emergency Department
Admission: EM | Admit: 2022-05-27 | Discharge: 2022-05-27 | Disposition: A | Discharge status: Home / Self Care | Payer: Managed Care, Other (non HMO) | Attending: Emergency Medicine | Admitting: Emergency Medicine

## 2022-05-27 DIAGNOSIS — J020 Streptococcal pharyngitis: Secondary | ICD-10-CM | POA: Insufficient documentation

## 2022-05-27 DIAGNOSIS — J029 Acute pharyngitis, unspecified: Secondary | ICD-10-CM | POA: Diagnosis present

## 2022-05-27 LAB — COMPREHENSIVE METABOLIC PANEL
ALT: 11 U/L (ref 0–44)
AST: 16 U/L (ref 15–41)
Albumin: 4.1 g/dL (ref 3.5–5.0)
Alkaline Phosphatase: 53 U/L (ref 38–126)
Anion gap: 7 (ref 5–15)
BUN: 5 mg/dL — ABNORMAL LOW (ref 6–20)
CO2: 24 mmol/L (ref 22–32)
Calcium: 9 mg/dL (ref 8.9–10.3)
Chloride: 104 mmol/L (ref 98–111)
Creatinine, Ser: 0.7 mg/dL (ref 0.44–1.00)
GFR, Estimated: >60 mL/min (ref >60)
Glucose, Bld: 98 mg/dL (ref 70–99)
Potassium: 3.5 mmol/L (ref 3.5–5.1)
Sodium: 135 mmol/L (ref 135–145)
Total Bilirubin: 0.5 mg/dL (ref 0.3–1.2)
Total Protein: 8.4 g/dL — ABNORMAL HIGH (ref 6.5–8.1)

## 2022-05-27 LAB — GROUP A STREP BY PCR: Group A Strep by PCR: DETECTED — AB

## 2022-05-27 LAB — CBC WITH DIFFERENTIAL/PLATELET
Abs Immature Granulocytes: 0.04 10*3/uL (ref 0.00–0.07)
Basophils Absolute: 0 10*3/uL (ref 0.0–0.1)
Basophils Relative: 0 %
Eosinophils Absolute: 0 10*3/uL (ref 0.0–0.5)
Eosinophils Relative: 0 %
HCT: 33 % — ABNORMAL LOW (ref 36.0–46.0)
Hemoglobin: 10.4 g/dL — ABNORMAL LOW (ref 12.0–15.0)
Immature Granulocytes: 0 %
Lymphocytes Relative: 16 %
Lymphs Abs: 1.6 10*3/uL (ref 0.7–4.0)
MCH: 26 pg (ref 26.0–34.0)
MCHC: 31.5 g/dL (ref 30.0–36.0)
MCV: 82.5 fL (ref 80.0–100.0)
Monocytes Absolute: 0.9 10*3/uL (ref 0.1–1.0)
Monocytes Relative: 9 %
Neutro Abs: 7.3 10*3/uL (ref 1.7–7.7)
Neutrophils Relative %: 75 %
Platelets: 328 10*3/uL (ref 150–400)
RBC: 4 MIL/uL (ref 3.87–5.11)
RDW: 15.8 % — ABNORMAL HIGH (ref 11.5–15.5)
WBC: 9.9 10*3/uL (ref 4.0–10.5)
nRBC: 0 % (ref 0.0–0.2)

## 2022-05-27 MED ORDER — DEXAMETHASONE SODIUM PHOSPHATE 10 MG/ML IJ SOLN
10.0000 mg | Freq: Once | INTRAMUSCULAR | Status: AC
  Administered 2022-05-27: 10 mg via INTRAVENOUS
  Filled 2022-05-27: qty 1

## 2022-05-27 MED ORDER — AMOXICILLIN 400 MG/5ML PO SUSR: 500.0000 mg | Freq: Three times a day (TID) | ORAL | 0 refills | Status: AC

## 2022-05-27 MED ORDER — AMOXICILLIN 250 MG/5ML PO SUSR
500.0000 mg | Freq: Once | ORAL | Status: AC
  Administered 2022-05-27: 500 mg via ORAL
  Filled 2022-05-27: qty 10

## 2022-05-27 MED ORDER — IOHEXOL 300 MG/ML  SOLN
75.0000 mL | Freq: Once | INTRAMUSCULAR | Status: AC | PRN
  Administered 2022-05-27: 75 mL via INTRAVENOUS

## 2022-05-27 NOTE — ED Provider Notes (Signed)
[facility] Provider Note  Patient Contact: 6:00 PM (approximate)   History   Sore Throat   HPI  Rachael Walsh is a 26 y.o. female presents to the emergency department for 3 days of muffled voice and 10 out of 10 sore throat.  Patient states that she has had some low-grade fever at home.  States that she has been able to maintain her own secretions and denies a history of peritonsillar abscesses in the past.      Physical Exam   Triage Vital Signs: ED Triage Vitals  Enc Vitals Group     BP 05/27/22 1734 130/73     Pulse Rate 05/27/22 1734 99     Resp 05/27/22 1734 16     Temp 05/27/22 1734 99.3 F (37.4 C)     Temp Source 05/27/22 1734 Oral     SpO2 05/27/22 1734 99 %     Weight 05/27/22 1701 197 lb 1.5 oz (89.4 kg)     Height 05/27/22 1701 5\' 2"  (1.575 m)     Head Circumference --      Peak Flow --      Pain Score 05/27/22 1701 5     Pain Loc --      Pain Edu? --      Excl. in [place]? --     Most recent vital signs: Vitals:   05/27/22 1734 05/27/22 1950  BP: 130/73 132/81  Pulse: 99 92  Resp: 16 18  Temp: 99.3 F (37.4 C)   SpO2: 99% 98%     General: Alert and in no acute distress. Eyes:  PERRL. EOMI. Head: No acute traumatic findings ENT:      Ears:       Nose: No congestion/rhinnorhea.      Mouth/Throat: Mucous membranes are moist.  Posterior pharynx is difficult to visualize.  Patient's voice appears muffled. Neck: No stridor. No cervical spine tenderness to palpation. Cardiovascular:  Good peripheral perfusion Respiratory: Normal respiratory effort without tachypnea or retractions. Lungs CTAB. Good air entry to the bases with no decreased or absent breath sounds. Gastrointestinal: Bowel sounds 4 quadrants. Soft and nontender to palpation. No guarding or rigidity. No palpable masses. No distention. No CVA tenderness. Musculoskeletal: Full range of motion to all extremities.  Neurologic:  No gross focal neurologic deficits are  appreciated.  Skin:   No rash noted Other:   ED Results / Procedures / Treatments   Labs (all labs ordered are listed, but only abnormal results are displayed) Labs Reviewed  GROUP A STREP BY PCR - Abnormal; Notable for the following components:      Result Value   Group A Strep by PCR DETECTED (*)    All other components within normal limits  CBC WITH DIFFERENTIAL/PLATELET - Abnormal; Notable for the following components:   Hemoglobin 10.4 (*)    HCT 33.0 (*)    RDW 15.8 (*)    All other components within normal limits  COMPREHENSIVE METABOLIC PANEL - Abnormal; Notable for the following components:   BUN 5 (*)    Total Protein 8.4 (*)    All other components within normal limits        RADIOLOGY  I personally viewed and evaluated these images as part of my medical decision making, as well as reviewing the written report by the radiologist.  ED Provider Interpretation: I personally interpreted CT soft tissue neck and there was no evidence of peritonsillar abscess or retropharyngeal abscess.   PROCEDURES:  Critical Care performed: No  Procedures   MEDICATIONS ORDERED IN ED: Medications  dexamethasone (DECADRON) injection 10 mg (10 mg Intravenous Given 05/27/22 1808)  iohexol (OMNIPAQUE) 300 MG/ML solution 75 mL (75 mLs Intravenous Contrast Given 05/27/22 1849)  amoxicillin (AMOXIL) 250 MG/5ML suspension 500 mg (500 mg Oral Given 05/27/22 1947)     IMPRESSION / MDM / ASSESSMENT AND PLAN / ED COURSE  I reviewed the triage vital signs and the nursing notes.                             Assessment and plan:  Pharyngitis:  Differential diagnosis includes, but is not limited to, peritonsillar abscess, tonsillitis, group A strep...  26 year old female presents to the emergency department with pharyngitis for the past 3 to 4 days.  Vital signs are reassuring at triage.  On exam, patient was alert, active and nontoxic-appearing but did have muffled voice.  She was managing  her own secretions.  CT soft tissue neck was obtained which showed no evidence of peritonsillar or retropharyngeal abscess.  She did test positive for group A strep and will be treated with amoxicillin for 10 days.  Patient was given IV Decadron in the emergency department.   FINAL CLINICAL IMPRESSION(S) / ED DIAGNOSES   Final diagnoses:  Strep throat     Rx / DC Orders   ED Discharge Orders          Ordered    amoxicillin (AMOXIL) 400 MG/5ML suspension  3 times daily        05/27/22 1942             Note:  This document was prepared using Dragon voice recognition software and may include unintentional dictation errors.   Vallarie M[place], PA-C 05/27/22 2347    Carrie Mew, MD 06/01/22 [phone]

## 2022-05-27 NOTE — Discharge Instructions (Signed)
Take amoxicillin 3 times daily for the next 7 days.

## 2022-05-27 NOTE — ED Triage Notes (Signed)
C/O sore throat x 2 days.

## 2022-11-18 ENCOUNTER — Emergency Department (HOSPITAL_COMMUNITY)
Admission: EM | Admit: 2022-11-18 | Discharge: 2022-11-18 | Disposition: A | Discharge status: Home / Self Care | Payer: Managed Care, Other (non HMO) | Attending: Emergency Medicine | Admitting: Emergency Medicine

## 2022-11-18 ENCOUNTER — Encounter (HOSPITAL_COMMUNITY): Payer: Self-pay

## 2022-11-18 ENCOUNTER — Emergency Department (HOSPITAL_COMMUNITY): Payer: Managed Care, Other (non HMO)

## 2022-11-18 ENCOUNTER — Other Ambulatory Visit: Payer: Self-pay

## 2022-11-18 DIAGNOSIS — X58XXXA Exposure to other specified factors, initial encounter: Secondary | ICD-10-CM | POA: Insufficient documentation

## 2022-11-18 DIAGNOSIS — T148XXA Other injury of unspecified body region, initial encounter: Secondary | ICD-10-CM

## 2022-11-18 DIAGNOSIS — N939 Abnormal uterine and vaginal bleeding, unspecified: Secondary | ICD-10-CM | POA: Insufficient documentation

## 2022-11-18 DIAGNOSIS — S2232XA Fracture of one rib, left side, initial encounter for closed fracture: Secondary | ICD-10-CM | POA: Diagnosis not present

## 2022-11-18 DIAGNOSIS — S20309A Unspecified superficial injuries of unspecified front wall of thorax, initial encounter: Secondary | ICD-10-CM | POA: Diagnosis present

## 2022-11-18 DIAGNOSIS — R109 Unspecified abdominal pain: Secondary | ICD-10-CM

## 2022-11-18 LAB — BASIC METABOLIC PANEL
Anion gap: 9 (ref 5–15)
BUN: 7 mg/dL (ref 6–20)
CO2: 25 mmol/L (ref 22–32)
Calcium: 8.9 mg/dL (ref 8.9–10.3)
Chloride: 102 mmol/L (ref 98–111)
Creatinine, Ser: 0.62 mg/dL (ref 0.44–1.00)
GFR, Estimated: >60 mL/min (ref >60)
Glucose, Bld: 103 mg/dL — ABNORMAL HIGH (ref 70–99)
Potassium: 3.5 mmol/L (ref 3.5–5.1)
Sodium: 136 mmol/L (ref 135–145)

## 2022-11-18 LAB — PROTIME-INR
INR: 0.9 (ref 0.8–1.2)
Prothrombin Time: 11.7 seconds (ref 11.4–15.2)

## 2022-11-18 LAB — CBC WITH DIFFERENTIAL/PLATELET
Abs Immature Granulocytes: 0.01 10*3/uL (ref 0.00–0.07)
Basophils Absolute: 0 10*3/uL (ref 0.0–0.1)
Basophils Relative: 0 %
Eosinophils Absolute: 0.1 10*3/uL (ref 0.0–0.5)
Eosinophils Relative: 2 %
HCT: 35.3 % — ABNORMAL LOW (ref 36.0–46.0)
Hemoglobin: 11.3 g/dL — ABNORMAL LOW (ref 12.0–15.0)
Immature Granulocytes: 0 %
Lymphocytes Relative: 42 %
Lymphs Abs: 2.1 10*3/uL (ref 0.7–4.0)
MCH: 28.8 pg (ref 26.0–34.0)
MCHC: 32 g/dL (ref 30.0–36.0)
MCV: 89.8 fL (ref 80.0–100.0)
Monocytes Absolute: 0.5 10*3/uL (ref 0.1–1.0)
Monocytes Relative: 10 %
Neutro Abs: 2.3 10*3/uL (ref 1.7–7.7)
Neutrophils Relative %: 46 %
Platelets: 353 10*3/uL (ref 150–400)
RBC: 3.93 MIL/uL (ref 3.87–5.11)
RDW: 14.2 % (ref 11.5–15.5)
WBC: 5 10*3/uL (ref 4.0–10.5)
nRBC: 0 % (ref 0.0–0.2)

## 2022-11-18 LAB — URINALYSIS, ROUTINE W REFLEX MICROSCOPIC
Bacteria, UA: NONE SEEN
Bilirubin Urine: NEGATIVE
Glucose, UA: NEGATIVE mg/dL
Ketones, ur: NEGATIVE mg/dL
Leukocytes,Ua: NEGATIVE
Nitrite: NEGATIVE
Protein, ur: NEGATIVE mg/dL
Specific Gravity, Urine: 1.011 (ref 1.005–1.030)
pH: 7 (ref 5.0–8.0)

## 2022-11-18 LAB — PREGNANCY, URINE: Preg Test, Ur: NEGATIVE

## 2022-11-18 MED ORDER — NAPROXEN 500 MG PO TABS: 500.0000 mg | ORAL_TABLET | Freq: Two times a day (BID) | ORAL | 0 refills | Status: AC

## 2022-11-18 MED ORDER — LIDOCAINE 5 % EX PTCH: 1.0000 | MEDICATED_PATCH | CUTANEOUS | 0 refills | Status: AC

## 2022-11-18 MED ORDER — NAPROXEN 250 MG PO TABS
500.0000 mg | ORAL_TABLET | Freq: Once | ORAL | Status: AC
  Administered 2022-11-18: 500 mg via ORAL
  Filled 2022-11-18: qty 2

## 2022-11-18 NOTE — ED Provider Notes (Signed)
[facility] EMERGENCY DEPARTMENT Provider Note   CSN: [id number] Arrival date & time: 11/18/22  2158     History  Chief Complaint  Patient presents with   Flank Pain    Rachael Walsh is a 26 y.o. female.   Flank Pain     This patient is a 26 year old female, she denies chronic medical conditions and takes no daily medications.  She reports that over the last few days she has had several symptoms including having some flank pain which seem to get worse after she had a fall while she was drunk a couple of nights ago.  She woke up this morning and had some abnormal bruising on her right arm and her left leg and does not recall having an injury to those areas.  Several days ago she had a cute onset of vaginal bleeding which was very heavy and for which she states was not supposed to be at that time of the month.  She did not miss a cycle, her cycle had gone off a short time before so it was unusual for her to have heavy bleeding.  She is not bleeding anymore.  She denies any other gastrointestinal symptoms, no rectal bleeding, no diarrhea or constipation, no dysuria or hematuria or frequency.  She does complain of some left-sided flank pain which has been going on for a few days but got a lot worse after she fell.  She has no pain with deep breathing, no coughing or shortness of breath, no headache no sore throat.  Home Medications Prior to Admission medications   Medication Sig Start Date End Date Taking? Authorizing Provider  naproxen (NAPROSYN) 500 MG tablet Take 1 tablet (500 mg total) by mouth 2 (two) times daily with a meal. 11/18/22  Yes Eber Hong, MD  famotidine (PEPCID) 20 MG tablet Take 1 tablet (20 mg total) by mouth 2 (two) times daily. 03/27/21   Sharman Cheek, MD  ibuprofen (ADVIL) 600 MG tablet Take 1 tablet (600 mg total) by mouth every 6 (six) hours as needed. 09/04/20   Enid Derry, PA-C  ondansetron (ZOFRAN ODT) 4 MG disintegrating tablet Take 1 tablet (4 mg  total) by mouth every 8 (eight) hours as needed for nausea or vomiting. 03/27/21   Sharman Cheek, MD  potassium chloride (KLOR-CON) 10 MEQ tablet Take 1 tablet (10 mEq total) by mouth daily. 09/04/20   Enid Derry, PA-C      Allergies    Patient has no known allergies.    Review of Systems   Review of Systems  Genitourinary:  Positive for flank pain.  All other systems reviewed and are negative.   Physical Exam Updated Vital Signs BP 124/82 (BP Location: Left Arm)   Pulse 82   Temp 98.1 F (36.7 C) (Oral)   Resp 20   Ht 1.575 m (5\' 2" )   Wt 86.2 kg   LMP 10/26/2022   SpO2 100%   BMI 34.75 kg/m  Physical Exam Vitals and nursing note reviewed.  Constitutional:      General: She is not in acute distress.    Appearance: She is well-developed.  HENT:     Head: Normocephalic and atraumatic.     Mouth/Throat:     Pharynx: No oropharyngeal exudate.  Eyes:     General: No scleral icterus.       Right eye: No discharge.        Left eye: No discharge.     Conjunctiva/sclera: Conjunctivae normal.  Pupils: Pupils are equal, round, and reactive to light.  Neck:     Thyroid: No thyromegaly.     Vascular: No JVD.  Cardiovascular:     Rate and Rhythm: Normal rate and regular rhythm.     Heart sounds: Normal heart sounds. No murmur heard.    No friction rub. No gallop.  Pulmonary:     Effort: Pulmonary effort is normal. No respiratory distress.     Breath sounds: Normal breath sounds. No wheezing or rales.  Abdominal:     General: Bowel sounds are normal. There is no distension.     Palpations: Abdomen is soft. There is no mass.     Tenderness: There is no abdominal tenderness.  Musculoskeletal:        General: Tenderness present. Normal range of motion.     Cervical back: Normal range of motion and neck supple.     Comments: No tenderness of the arms of the legs in fact the joints are very supple and the compartments are very soft diffusely.  She is able to ambulate  without any difficulty.  She does have tenderness over the left CVA area and the left ribs on the left.  There is no crepitance or subcutaneous emphysema  Lymphadenopathy:     Cervical: No cervical adenopathy.  Skin:    General: Skin is warm and dry.     Findings: Bruising present. No erythema or rash.     Comments: Areas of oval like bruising to the right upper arm laterally as well as the left lower leg laterally.  She has a small bruise on her left anterior neck which she states was a hickey  Neurological:     Mental Status: She is alert.     Coordination: Coordination normal.  Psychiatric:        Behavior: Behavior normal.     ED Results / Procedures / Treatments   Labs (all labs ordered are listed, but only abnormal results are displayed) Labs Reviewed  CBC WITH DIFFERENTIAL/PLATELET - Abnormal; Notable for the following components:      Result Value   Hemoglobin 11.3 (*)    HCT 35.3 (*)    All other components within normal limits  PROTIME-INR  URINALYSIS, ROUTINE W REFLEX MICROSCOPIC  PREGNANCY, URINE  BASIC METABOLIC PANEL    EKG None  Radiology No results found.  Procedures Procedures    Medications Ordered in ED Medications  naproxen (NAPROSYN) tablet 500 mg (has no administration in time range)    ED Course/ Medical Decision Making/ A&P                           Medical Decision Making Amount and/or Complexity of Data Reviewed Labs: ordered. Radiology: ordered.  Risk Prescription drug management.   This patient presents to the ED for concern of bleeding differential diagnosis includes Trauma, abnormal bleeding, abnormal bruising.  Coagulopathy    Additional history obtained:  Additional history obtained from emergency department visits, multiple visits over time External records from outside source obtained and reviewed including prior lab draws, last CBC showed a hemoglobin of 10.45 months ago   Lab Tests:  I Ordered, and personally  interpreted labs.  The pertinent results include:  CBC without signfificant anemia.   Imaging Studies ordered:  I ordered imaging studies including xray of the ribs  I independently visualized and interpreted imaging which showed no acute findings. I agree with the radiologist interpretation  Medicines ordered and prescription drug management:  I ordered medication including naprosyn  for pain  Reevaluation of the patient after these medicines showed that the patient improved I have reviewed the patients home medicines and have made adjustments as needed   Problem List / ED Course:  Flank pain - unlikely to be pathological given the location of the pain on the ribs on the left - no bruising there - but worse after falling - she has no fractures of ribs, nsaids will be used, pt agreeable to return - doubt internal bleeding or hemorrhage given no tachycardia, normal Hgb and no hypotension.   Social Determinants of Health:  None           Final Clinical Impression(s) / ED Diagnoses Final diagnoses:  Flank pain  Vaginal bleeding  Bruising    Rx / DC Orders ED Discharge Orders          Ordered    naproxen (NAPROSYN) 500 MG tablet  2 times daily with meals        11/18/22 2253              Eber Hong, MD 11/18/22 2256

## 2022-11-18 NOTE — Discharge Instructions (Signed)
Testing has been reassuring, there is no acute findings to suggest the need for concern, if you develop severe or worsening bleeding or other symptoms return to the ER immediately.  You may take Naprosyn for pain  Please take Naprosyn, 500mg  by mouth twice daily as needed for pain - this in an antiinflammatory medicine (NSAID) and is similar to ibuprofen - many people feel that it is stronger than ibuprofen and it is easier to take since it is a smaller pill.  Please use this only for 1 week - if your pain persists, you will need to follow up with your doctor in the office for ongoing guidance and pain control.  Thank you for allowing to treat you in the emergency department today.  After reviewing your examination and potential testing that was done it appears that you are safe to go home.  I would like for you to follow-up with your doctor within the next several days, have them obtain your results and follow-up with them to review all of these tests.  If you should develop severe or worsening symptoms return to the emergency department immediately

## 2022-11-18 NOTE — ED Notes (Signed)
Patient transported to X-ray 

## 2022-11-18 NOTE — ED Triage Notes (Signed)
Pt c/o unexplained bruises on her L arm and R thigh after drinking too much ETOH over the weekend. Pt also reports significant vaginal bleeding unrelated to her menstrual cycle. Pt also c/o L flank pain.

## 2022-11-18 NOTE — ED Provider Notes (Signed)
  Provider Note MRN:  [id number]  Arrival date & time: 11/18/22    ED Course and Medical Decision Making  Assumed care from Dr. Hyacinth Meeker at shift change.  Fall while intoxicated, does not really remember the fall, some persistent left rib pain.  Some recent vaginal bleeding.  hCG is negative, labs reassuring.  X-ray does show likely 11th rib fracture.  No pneumothorax.  Patient made aware of the findings, appropriate for discharge on pain regimen.  Procedures  Final Clinical Impressions(s) / ED Diagnoses     ICD-10-CM   1. Flank pain  R10.9     2. Vaginal bleeding  N93.9     3. Bruising  T14.8XXA     4. Closed fracture of one rib of left side, initial encounter  S22.32XA       ED Discharge Orders          Ordered    naproxen (NAPROSYN) 500 MG tablet  2 times daily with meals        11/18/22 2253    lidocaine (LIDODERM) 5 %  Every 24 hours        11/18/22 2315              Discharge Instructions      Testing has been reassuring, there is no acute findings to suggest the need for concern, if you develop severe or worsening bleeding or other symptoms return to the ER immediately.  You may take Naprosyn for pain  Please take Naprosyn, 500mg  by mouth twice daily as needed for pain - this in an antiinflammatory medicine (NSAID) and is similar to ibuprofen - many people feel that it is stronger than ibuprofen and it is easier to take since it is a smaller pill.  Please use this only for 1 week - if your pain persists, you will need to follow up with your doctor in the office for ongoing guidance and pain control.  Thank you for allowing to treat you in the emergency department today.  After reviewing your examination and potential testing that was done it appears that you are safe to go home.  I would like for you to follow-up with your doctor within the next several days, have them obtain your results and follow-up with them to review all of these tests.  If you should develop  severe or worsening symptoms return to the emergency department immediately    [place]. Elmer Sow, MD [facility] Emergency Medicine [facility] [email or web address]    [facility], MD 11/18/22 [phone]

## 2022-11-20 ENCOUNTER — Emergency Department: Payer: Managed Care, Other (non HMO)

## 2022-11-20 ENCOUNTER — Emergency Department
Admission: EM | Admit: 2022-11-20 | Discharge: 2022-11-20 | Disposition: A | Discharge status: Home / Self Care | Payer: Managed Care, Other (non HMO) | Attending: Emergency Medicine | Admitting: Emergency Medicine

## 2022-11-20 ENCOUNTER — Other Ambulatory Visit: Payer: Self-pay

## 2022-11-20 DIAGNOSIS — S7012XA Contusion of left thigh, initial encounter: Secondary | ICD-10-CM | POA: Diagnosis not present

## 2022-11-20 DIAGNOSIS — S5011XA Contusion of right forearm, initial encounter: Secondary | ICD-10-CM | POA: Insufficient documentation

## 2022-11-20 DIAGNOSIS — S299XXA Unspecified injury of thorax, initial encounter: Secondary | ICD-10-CM | POA: Diagnosis present

## 2022-11-20 DIAGNOSIS — S29001A Unspecified injury of muscle and tendon of front wall of thorax, initial encounter: Secondary | ICD-10-CM | POA: Insufficient documentation

## 2022-11-20 DIAGNOSIS — W1839XA Other fall on same level, initial encounter: Secondary | ICD-10-CM | POA: Insufficient documentation

## 2022-11-20 DIAGNOSIS — T07XXXA Unspecified multiple injuries, initial encounter: Secondary | ICD-10-CM

## 2022-11-20 LAB — BASIC METABOLIC PANEL
Anion gap: 6 (ref 5–15)
BUN: 7 mg/dL (ref 6–20)
CO2: 19 mmol/L — ABNORMAL LOW (ref 22–32)
Calcium: 8.9 mg/dL (ref 8.9–10.3)
Chloride: 110 mmol/L (ref 98–111)
Creatinine, Ser: 0.63 mg/dL (ref 0.44–1.00)
GFR, Estimated: >60 mL/min (ref >60)
Glucose, Bld: 93 mg/dL (ref 70–99)
Potassium: 4.3 mmol/L (ref 3.5–5.1)
Sodium: 135 mmol/L (ref 135–145)

## 2022-11-20 LAB — CBC WITH DIFFERENTIAL/PLATELET
Abs Immature Granulocytes: 0.01 10*3/uL (ref 0.00–0.07)
Basophils Absolute: 0 10*3/uL (ref 0.0–0.1)
Basophils Relative: 1 %
Eosinophils Absolute: 0.1 10*3/uL (ref 0.0–0.5)
Eosinophils Relative: 2 %
HCT: 35.7 % — ABNORMAL LOW (ref 36.0–46.0)
Hemoglobin: 11.4 g/dL — ABNORMAL LOW (ref 12.0–15.0)
Immature Granulocytes: 0 %
Lymphocytes Relative: 45 %
Lymphs Abs: 1.9 10*3/uL (ref 0.7–4.0)
MCH: 28.3 pg (ref 26.0–34.0)
MCHC: 31.9 g/dL (ref 30.0–36.0)
MCV: 88.6 fL (ref 80.0–100.0)
Monocytes Absolute: 0.3 10*3/uL (ref 0.1–1.0)
Monocytes Relative: 6 %
Neutro Abs: 2 10*3/uL (ref 1.7–7.7)
Neutrophils Relative %: 46 %
Platelets: 338 10*3/uL (ref 150–400)
RBC: 4.03 MIL/uL (ref 3.87–5.11)
RDW: 13.8 % (ref 11.5–15.5)
WBC: 4.3 10*3/uL (ref 4.0–10.5)
nRBC: 0 % (ref 0.0–0.2)

## 2022-11-20 NOTE — Discharge Instructions (Addendum)
-  Continue to utilize your meloxicam and lidocaine patches as needed.  -Continue to take deep breaths throughout the day to prevent pneumonia.  -Follow-up with your primary care provider if your symptoms fail to improve after 1 week.  -Return to the emergency department anytime if you begin to experience any new or worsening symptoms.

## 2022-11-20 NOTE — ED Triage Notes (Signed)
C/o left rib pain.  Patient fell over the weekend and was seen and treated at [facility] ED, diagnosed with a rib fx.  States she is concerned that her rib has punctured something and wants to be seen.  AAOx3.  Skin warm and dry. NAD

## 2022-11-20 NOTE — ED Provider Notes (Signed)
[facility] Provider Note    Event Date/Time   First MD Initiated Contact with Patient 11/20/22 1436     (approximate)   History   Chief Complaint Rib Injury   HPI Rachael Walsh is a 26 y.o. female, no significant medical history, presents to the emergency department for evaluation of left rib pain.  She states that she fell on her left side over the weekend and was seen initially at [facility] emergency department.  They performed a chest x-ray, which showed a minimally displaced rib fracture on the 11th rib.  Since then, she states that she has had persistent pain on the left side of her chest, as well as some bruising develop along her left thigh and right forearm.  She states that she does not remember hitting these areas.  She expresses concern for "internal bleeding", as well as possible punctured lung.  Denies abdominal pain, flank pain, nausea/vomiting, diarrhea, dysuria, headache, weakness, numbness/tingling in upper or lower extremities, or dizziness/lightheadedness.  History Limitations: No limitations.        Physical Exam  Triage Vital Signs: ED Triage Vitals  Enc Vitals Group     BP 11/20/22 1430 120/73     Pulse Rate 11/20/22 1430 75     Resp 11/20/22 1430 18     Temp 11/20/22 1430 98.3 F (36.8 C)     Temp Source 11/20/22 1430 Oral     SpO2 11/20/22 1430 98 %     Weight 11/20/22 1413 189 lb 13.1 oz (86.1 kg)     Height 11/20/22 1413 5\' 2"  (1.575 m)     Head Circumference --      Peak Flow --      Pain Score --      Pain Loc --      Pain Edu? --      Excl. in [place]? --     Most recent vital signs: Vitals:   11/20/22 1430  BP: 120/73  Pulse: 75  Resp: 18  Temp: 98.3 F (36.8 C)  SpO2: 98%    General: Awake, NAD.  Skin: Warm, dry. No rashes or lesions.  Eyes: PERRL. Conjunctivae normal.  CV: Good peripheral perfusion.  Resp: Normal effort.  Lung sounds are clear bilaterally in apices and bases. Abd: Soft, non-tender. No  distention.  Neuro: At baseline. No gross neurological deficits.  Musculoskeletal: Normal ROM of all extremities.  Focused Exam: 5 cm area of ecchymosis along the medial aspect of the left thigh.  Ecchymosis present along the right forearm to, approximately 3 cm in diameter.  Tenderness appreciated in both spots as well.  PMS intact distally in all extremities.  She maintains normal range of motion of all extremities.  No ecchymosis present along the left side of the chest.  No significant tenderness.  Physical Exam    ED Results / Procedures / Treatments  Labs (all labs ordered are listed, but only abnormal results are displayed) Labs Reviewed  CBC WITH DIFFERENTIAL/PLATELET - Abnormal; Notable for the following components:      Result Value   Hemoglobin 11.4 (*)    HCT 35.7 (*)    All other components within normal limits  BASIC METABOLIC PANEL     EKG N/A.    RADIOLOGY  ED Provider Interpretation: I personally viewed and interpreted this x-ray, no evidence of pneumothorax or significant rib fracture at this time.  DG Chest 2 View  Result Date: 11/20/2022 CLINICAL DATA:  Chest pain, rib  fracture EXAM: CHEST - 2 VIEW COMPARISON:  None Available. FINDINGS: The heart size and mediastinal contours are within normal limits. Both lungs are clear. The visualized skeletal structures are unremarkable. IMPRESSION: No active cardiopulmonary disease. Electronically Signed   By: Helyn Numbers M.D.   On: 11/20/2022 15:20    PROCEDURES:  Critical Care performed: N/A.  Procedures    MEDICATIONS ORDERED IN ED: Medications - No data to display   IMPRESSION / MDM / ASSESSMENT AND PLAN / ED COURSE  I reviewed the triage vital signs and the nursing notes.                              Differential diagnosis includes, but is not limited to, rib fracture, pneumothorax, anemia, thrombocytopenia, contusions.  ED Course Patient appears well, vitals within normal limits.  NAD.  CBC  shows no leukocytosis.  Mild anemia present at 11.4 hemoglobin.  Consistent with her baseline.  Assessment/Plan Patient presents with concern for punctured lung/internal bleeding after discovering some bruising on her left thigh and right forearm.  Fortunately, her blood work does not show any evidence of significant anemia or thrombocytopenia.  X-ray shows no evidence of displaced rib fractures or pneumothorax.  Patient is not concerned for any fractures or significant injuries on her thigh or forearm.  She states that is not bothering her.  She is relieved by these findings.  She is currently on meloxicam and lidocaine patches, she says that this is working well for her.  Will discharge.  Provided the patient with anticipatory guidance, return precautions, and educational material. Encouraged the patient to return to the emergency department at any time if they begin to experience any new or worsening symptoms. Patient expressed understanding and agreed with the plan.   Patient's presentation is most consistent with acute complicated illness / injury requiring diagnostic workup.       FINAL CLINICAL IMPRESSION(S) / ED DIAGNOSES   Final diagnoses:  Rib injury  Multiple contusions     Rx / DC Orders   ED Discharge Orders     None        Note:  This document was prepared using Dragon voice recognition software and may include unintentional dictation errors.   Varney Daily, [place] 11/20/22 1617    Concha Se, MD 11/21/22 1058

## 2022-12-27 ENCOUNTER — Emergency Department (HOSPITAL_COMMUNITY)
Admission: EM | Admit: 2022-12-27 | Discharge: 2022-12-27 | Disposition: A | Discharge status: Home / Self Care | Payer: Managed Care, Other (non HMO) | Attending: Emergency Medicine | Admitting: Emergency Medicine

## 2022-12-27 ENCOUNTER — Other Ambulatory Visit: Payer: Self-pay

## 2022-12-27 DIAGNOSIS — F17200 Nicotine dependence, unspecified, uncomplicated: Secondary | ICD-10-CM | POA: Diagnosis not present

## 2022-12-27 DIAGNOSIS — Z1152 Encounter for screening for COVID-19: Secondary | ICD-10-CM | POA: Insufficient documentation

## 2022-12-27 DIAGNOSIS — J111 Influenza due to unidentified influenza virus with other respiratory manifestations: Secondary | ICD-10-CM

## 2022-12-27 DIAGNOSIS — J101 Influenza due to other identified influenza virus with other respiratory manifestations: Secondary | ICD-10-CM | POA: Insufficient documentation

## 2022-12-27 DIAGNOSIS — R509 Fever, unspecified: Secondary | ICD-10-CM | POA: Diagnosis present

## 2022-12-27 LAB — RESP PANEL BY RT-PCR (RSV, FLU A&B, COVID)  RVPGX2
Influenza A by PCR: POSITIVE — AB
Influenza B by PCR: NEGATIVE
Resp Syncytial Virus by PCR: NEGATIVE
SARS Coronavirus 2 by RT PCR: NEGATIVE

## 2022-12-27 MED ORDER — ONDANSETRON 4 MG PO TBDP
4.0000 mg | ORAL_TABLET | Freq: Three times a day (TID) | ORAL | 0 refills | Status: AC | PRN
Start: 2022-12-27 — End: ?

## 2022-12-27 MED ORDER — ONDANSETRON 4 MG PO TBDP
4.0000 mg | ORAL_TABLET | Freq: Once | ORAL | Status: AC
  Administered 2022-12-27: 4 mg via ORAL
  Filled 2022-12-27: qty 1

## 2022-12-27 MED ORDER — ALBUTEROL SULFATE HFA 108 (90 BASE) MCG/ACT IN AERS
2.0000 | INHALATION_SPRAY | Freq: Once | RESPIRATORY_TRACT | Status: AC
  Administered 2022-12-27: 2 via RESPIRATORY_TRACT
  Filled 2022-12-27: qty 6.7

## 2022-12-27 NOTE — Discharge Instructions (Addendum)
You were seen today for upper respiratory symptoms.  Given your known sick contact, you likely have the flu.  Check MyChart for test results.  Otherwise this is likely a viral illness.  Use the inhaler as needed for ongoing cough.  Make sure that you are staying hydrated.  Use Zofran for any nausea or vomiting.

## 2022-12-27 NOTE — ED Triage Notes (Signed)
Pt presents with fever, chills, runny nose, cough x5 days.

## 2022-12-27 NOTE — ED Provider Notes (Signed)
[facility] EMERGENCY DEPARTMENT Provider Note   CSN: [id number] Arrival date & time: 12/27/22  0302     History  Chief Complaint  Patient presents with   Fever    Rachael Walsh is a 27 y.o. female.  HPI     This is a 27 year old female who presents with upper respiratory symptoms.  Patient reports 4 to 5-day history of cough, chills, runny nose, body aches.  She also reports nausea and vomiting.  She has a known sick contact that was flu positive.  She has not taken her temperature at home so does not know whether she has had a fever.  She took NyQuil prior to arrival with minimal symptom relief.  At baseline she is a smoker.  Home Medications Prior to Admission medications   Medication Sig Start Date End Date Taking? Authorizing Provider  ondansetron (ZOFRAN-ODT) 4 MG disintegrating tablet Take 1 tablet (4 mg total) by mouth every 8 (eight) hours as needed for nausea or vomiting. 12/27/22  Yes Aleathea Pugmire, Mayer Masker, MD  famotidine (PEPCID) 20 MG tablet Take 1 tablet (20 mg total) by mouth 2 (two) times daily. 03/27/21   Sharman Cheek, MD  ibuprofen (ADVIL) 600 MG tablet Take 1 tablet (600 mg total) by mouth every 6 (six) hours as needed. 09/04/20   Enid Derry, PA-C  lidocaine (LIDODERM) 5 % Place 1 patch onto the skin daily. Remove & Discard patch within 12 hours or as directed by MD 11/18/22   Sabas Sous, MD  naproxen (NAPROSYN) 500 MG tablet Take 1 tablet (500 mg total) by mouth 2 (two) times daily with a meal. 11/18/22   Eber Hong, MD  ondansetron (ZOFRAN ODT) 4 MG disintegrating tablet Take 1 tablet (4 mg total) by mouth every 8 (eight) hours as needed for nausea or vomiting. 03/27/21   Sharman Cheek, MD  potassium chloride (KLOR-CON) 10 MEQ tablet Take 1 tablet (10 mEq total) by mouth daily. 09/04/20   Enid Derry, PA-C      Allergies    Patient has no known allergies.    Review of Systems   Review of Systems  Constitutional:  Positive for chills. Negative  for fever.  Respiratory:  Positive for cough.   Gastrointestinal:  Positive for nausea and vomiting.  All other systems reviewed and are negative.   Physical Exam Updated Vital Signs BP (!) 112/100   Pulse 88   Temp 98 F (36.7 C) (Oral)   Resp 18   Ht 1.575 m (5\' 2" )   Wt 86.2 kg   SpO2 95%   BMI 34.75 kg/m  Physical Exam Vitals and nursing note reviewed.  Constitutional:      Appearance: She is well-developed. She is not ill-appearing.  HENT:     Head: Normocephalic and atraumatic.     Nose: Congestion present.     Mouth/Throat:     Mouth: Mucous membranes are moist.  Eyes:     Pupils: Pupils are equal, round, and reactive to light.  Cardiovascular:     Rate and Rhythm: Normal rate and regular rhythm.     Heart sounds: Normal heart sounds.  Pulmonary:     Effort: Pulmonary effort is normal. No respiratory distress.     Breath sounds: Wheezing present.     Comments: Fair air movement, occasional expiratory wheeze Abdominal:     General: Bowel sounds are normal.     Palpations: Abdomen is soft.  Musculoskeletal:     Cervical back: Neck supple.  Skin:  General: Skin is warm and dry.  Neurological:     Mental Status: She is alert and oriented to person, place, and time.  Psychiatric:        Mood and Affect: Mood normal.     ED Results / Procedures / Treatments   Labs (all labs ordered are listed, but only abnormal results are displayed) Labs Reviewed  RESP PANEL BY RT-PCR (RSV, FLU A&B, COVID)  [id number]    EKG None  Radiology No results found.  Procedures Procedures    Medications Ordered in ED Medications  ondansetron (ZOFRAN-ODT) disintegrating tablet 4 mg (has no administration in time range)  albuterol (VENTOLIN HFA) 108 (90 Base) MCG/ACT inhaler 2 puff (has no administration in time range)    ED Course/ Medical Decision Making/ A&P                           Medical Decision Making Risk Prescription drug management.   This patient  presents to the ED for concern of upper respiratory symptoms, this involves an extensive number of treatment options, and is a complaint that carries with it a high risk of complications and morbidity.  I considered the following differential and admission for this acute, potentially life threatening condition.  The differential diagnosis includes but, influenza, RSV, pneumonia  MDM:    This is a 27 year old female who presents with upper respiratory symptoms.  She is nontoxic.  Vital signs are notable for blood pressure 112/100.  She is not in any respiratory distress.  She does have some wheezing on exam but is a smoker at baseline.  Discussed with her that I felt that she likely had influenza given history and physical exam.  Will send COVID and influenza testing.  Discussed with her supportive measures.  Will provide with an inhaler given wheezing.  Will also give Zofran.  At this time she appears hydrated.  Do not feel she needs other lab work.  She will check MyChart for results.  Do not feel she needs imaging at this time.  (Labs, imaging, consults)  Labs: I Ordered, and personally interpreted labs.  The pertinent results include: COVID and influenza testing  Imaging Studies ordered: I ordered imaging studies including none I independently visualized and interpreted imaging. I agree with the radiologist interpretation  Additional history obtained from chart review.  External records from outside source obtained and reviewed including prior evaluations  Cardiac Monitoring: The patient was maintained on a cardiac monitor.  I personally viewed and interpreted the cardiac monitored which showed an underlying rhythm of: Sinus rhythm  Reevaluation: After the interventions noted above, I reevaluated the patient and found that they have :stayed the same  Social Determinants of Health:  smoker  Disposition: Discharge  Co morbidities that complicate the patient evaluation No past medical  history on file.   Medicines Meds ordered this encounter  Medications   ondansetron (ZOFRAN-ODT) disintegrating tablet 4 mg   albuterol (VENTOLIN HFA) 108 (90 Base) MCG/ACT inhaler 2 puff   ondansetron (ZOFRAN-ODT) 4 MG disintegrating tablet    Sig: Take 1 tablet (4 mg total) by mouth every 8 (eight) hours as needed for nausea or vomiting.    Dispense:  20 tablet    Refill:  0    I have reviewed the patients home medicines and have made adjustments as needed  Problem List / ED Course: Problem List Items Addressed This Visit   None Visit Diagnoses  Influenza-like illness    -  Primary                   Final Clinical Impression(s) / ED Diagnoses Final diagnoses:  Influenza-like illness    Rx / DC Orders ED Discharge Orders          Ordered    ondansetron (ZOFRAN-ODT) 4 MG disintegrating tablet  Every 8 hours PRN        12/27/22 0358              Merryl Hacker, MD 12/27/22 0405

## 2022-12-27 NOTE — ED Notes (Signed)
Pt reports last taking NyQuil @ 0215 this morning PTA w/o relief of symptoms.

## 2023-04-22 ENCOUNTER — Ambulatory Visit
Admission: EM | Admit: 2023-04-22 | Discharge: 2023-04-22 | Disposition: A | Discharge status: Home / Self Care | Payer: Managed Care, Other (non HMO) | Attending: Nurse Practitioner | Admitting: Nurse Practitioner

## 2023-04-22 ENCOUNTER — Encounter: Payer: Self-pay | Admitting: Emergency Medicine

## 2023-04-22 ENCOUNTER — Other Ambulatory Visit: Payer: Managed Care, Other (non HMO)

## 2023-04-22 ENCOUNTER — Ambulatory Visit: Payer: Managed Care, Other (non HMO)

## 2023-04-22 ENCOUNTER — Other Ambulatory Visit: Payer: Self-pay

## 2023-04-22 DIAGNOSIS — S93401A Sprain of unspecified ligament of right ankle, initial encounter: Secondary | ICD-10-CM

## 2023-04-22 NOTE — Discharge Instructions (Addendum)
Your right ankle is most likely sprained.  Please keep the ACE wrap on while you are awake and walking on your ankle.  When sitting down, keep your foot elevated and apply ice 15 minutes on, 45 minutes off while awake. You can take Tylenol [phone] mg every 6 hours as needed for pain alternating with ibuprofen 800 mg every 8 hours as needed for pain.   Follow up with Podiatry if symptoms persist or worsen despite treatment.
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
## 2023-04-22 NOTE — ED Triage Notes (Signed)
Pt reports right ankle pain after ankle "turned back on itself"last night. Pt able to bear weight. No obvious deformity noted.

## 2023-04-27 ENCOUNTER — Encounter: Payer: Managed Care, Other (non HMO) | Admitting: Podiatry

## 2023-04-27 ENCOUNTER — Ambulatory Visit: Payer: Managed Care, Other (non HMO)

## 2023-04-27 NOTE — Progress Notes (Signed)
NO SHOW   This encounter was created in error - please disregard. 

## 2023-05-12 ENCOUNTER — Ambulatory Visit: Payer: Managed Care, Other (non HMO) | Admitting: Podiatry

## 2023-05-12 DIAGNOSIS — S93401A Sprain of unspecified ligament of right ankle, initial encounter: Secondary | ICD-10-CM

## 2023-08-17 ENCOUNTER — Emergency Department (HOSPITAL_COMMUNITY)
Admission: EM | Admit: 2023-08-17 | Discharge: 2023-08-17 | Disposition: A | Discharge status: Home / Self Care | Payer: Managed Care, Other (non HMO) | Attending: Emergency Medicine | Admitting: Emergency Medicine

## 2023-08-17 ENCOUNTER — Other Ambulatory Visit: Payer: Self-pay

## 2023-08-17 ENCOUNTER — Emergency Department (HOSPITAL_COMMUNITY): Payer: Managed Care, Other (non HMO)

## 2023-08-17 DIAGNOSIS — F419 Anxiety disorder, unspecified: Secondary | ICD-10-CM | POA: Insufficient documentation

## 2023-08-17 DIAGNOSIS — R202 Paresthesia of skin: Secondary | ICD-10-CM | POA: Diagnosis present

## 2023-08-17 LAB — BASIC METABOLIC PANEL
Anion gap: 6 (ref 5–15)
BUN: 10 mg/dL (ref 6–20)
CO2: 22 mmol/L (ref 22–32)
Calcium: 8.8 mg/dL — ABNORMAL LOW (ref 8.9–10.3)
Chloride: 106 mmol/L (ref 98–111)
Creatinine, Ser: 0.7 mg/dL (ref 0.44–1.00)
GFR, Estimated: >60 mL/min (ref >60)
Glucose, Bld: 107 mg/dL — ABNORMAL HIGH (ref 70–99)
Potassium: 3.5 mmol/L (ref 3.5–5.1)
Sodium: 134 mmol/L — ABNORMAL LOW (ref 135–145)

## 2023-08-17 LAB — POC URINE PREG, ED: Preg Test, Ur: NEGATIVE

## 2023-08-17 MED ORDER — HYDROXYZINE HCL 25 MG PO TABS: 25.0000 mg | ORAL_TABLET | Freq: Three times a day (TID) | ORAL | 0 refills | Status: AC | PRN

## 2023-08-17 NOTE — ED Provider Notes (Signed)
[place] EMERGENCY DEPARTMENT AT [facility] Provider Note   CSN: [id number] Arrival date & time: 08/17/23  3086     History  Chief Complaint  Patient presents with   Tingling    Rachael Walsh is a 27 y.o. female.  Patient presents to the emergency department for intermittent episodes of tingling in her hands and sometimes her feet.  Tonight the left hand is affected more but it is on both sides.  When this is occurring she sometimes feels short of breath too.  She reports that she tries to slow her breathing rate down but is not always able to.       Home Medications Prior to Admission medications   Medication Sig Start Date End Date Taking? Authorizing Provider  hydrOXYzine (ATARAX) 25 MG tablet Take 1 tablet (25 mg total) by mouth every 8 (eight) hours as needed for anxiety. 08/17/23  Yes Merrit Friesen, Canary Brim, MD  famotidine (PEPCID) 20 MG tablet Take 1 tablet (20 mg total) by mouth 2 (two) times daily. Patient taking differently: Take 20 mg by mouth 2 (two) times daily. As needed 03/27/21   Sharman Cheek, MD  ibuprofen (ADVIL) 600 MG tablet Take 1 tablet (600 mg total) by mouth every 6 (six) hours as needed. 09/04/20   Enid Derry, PA-C  lidocaine (LIDODERM) 5 % Place 1 patch onto the skin daily. Remove & Discard patch within 12 hours or as directed by MD 11/18/22   Sabas Sous, MD  naproxen (NAPROSYN) 500 MG tablet Take 1 tablet (500 mg total) by mouth 2 (two) times daily with a meal. 11/18/22   Eber Hong, MD  ondansetron (ZOFRAN ODT) 4 MG disintegrating tablet Take 1 tablet (4 mg total) by mouth every 8 (eight) hours as needed for nausea or vomiting. 03/27/21   Sharman Cheek, MD  ondansetron (ZOFRAN-ODT) 4 MG disintegrating tablet Take 1 tablet (4 mg total) by mouth every 8 (eight) hours as needed for nausea or vomiting. 12/27/22   Horton, Mayer Masker, MD  potassium chloride (KLOR-CON) 10 MEQ tablet Take 1 tablet (10 mEq total) by mouth daily. 09/04/20    Enid Derry, PA-C      Allergies    Patient has no known allergies.    Review of Systems   Review of Systems  Physical Exam Updated Vital Signs BP 114/76   Pulse 86   Temp 98.7 F (37.1 C) (Oral)   Resp 18   Ht 5\' 2"  (1.575 m)   Wt 88.5 kg   LMP 08/12/2023 (Approximate)   SpO2 100%   BMI 35.67 kg/m  Physical Exam Vitals and nursing note reviewed.  Constitutional:      General: She is not in acute distress.    Appearance: She is well-developed.  HENT:     Head: Normocephalic and atraumatic.     Mouth/Throat:     Mouth: Mucous membranes are moist.  Eyes:     General: Vision grossly intact. Gaze aligned appropriately.     Extraocular Movements: Extraocular movements intact.     Conjunctiva/sclera: Conjunctivae normal.  Cardiovascular:     Rate and Rhythm: Normal rate and regular rhythm.     Pulses: Normal pulses.     Heart sounds: Normal heart sounds, S1 normal and S2 normal. No murmur heard.    No friction rub. No gallop.  Pulmonary:     Effort: Pulmonary effort is normal. No respiratory distress.     Breath sounds: Normal breath sounds.  Abdominal:  General: Bowel sounds are normal.     Palpations: Abdomen is soft.     Tenderness: There is no abdominal tenderness. There is no guarding or rebound.     Hernia: No hernia is present.  Musculoskeletal:        General: No swelling.     Cervical back: Full passive range of motion without pain, normal range of motion and neck supple. No spinous process tenderness or muscular tenderness. Normal range of motion.     Right lower leg: No edema.     Left lower leg: No edema.     Comments: Negative Tinel, negative Phalen, negative Finkelstein  Skin:    General: Skin is warm and dry.     Capillary Refill: Capillary refill takes less than 2 seconds.     Findings: No ecchymosis, erythema, rash or wound.  Neurological:     General: No focal deficit present.     Mental Status: She is alert and oriented to person, place,  and time.     GCS: GCS eye subscore is 4. GCS verbal subscore is 5. GCS motor subscore is 6.     Cranial Nerves: Cranial nerves 2-12 are intact.     Sensory: Sensation is intact.     Motor: Motor function is intact.     Coordination: Coordination is intact.  Psychiatric:        Attention and Perception: Attention normal.        Mood and Affect: Mood normal.        Speech: Speech normal.        Behavior: Behavior normal.     ED Results / Procedures / Treatments   Labs (all labs ordered are listed, but only abnormal results are displayed) Labs Reviewed  BASIC METABOLIC PANEL - Abnormal; Notable for the following components:      Result Value   Sodium 134 (*)    Glucose, Bld 107 (*)    Calcium 8.8 (*)    All other components within normal limits  POC URINE PREG, ED    EKG EKG Interpretation Date/Time:  Monday August 17 2023 04:48:31 EDT Ventricular Rate:  67 PR Interval:  145 QRS Duration:  89 QT Interval:  427 QTC Calculation: 451 R Axis:   50  Text Interpretation: Sinus rhythm Normal ECG Confirmed by Gilda Crease [phone]) on 08/17/2023 4:51:02 AM  Radiology DG Chest 2 View  Result Date: 08/17/2023 CLINICAL DATA:  Shortness of breath EXAM: CHEST - 2 VIEW COMPARISON:  11/20/2022 FINDINGS: Normal heart size and mediastinal contours. No acute infiltrate or edema. No effusion or pneumothorax. No acute osseous findings. IMPRESSION: Negative chest. Electronically Signed   By: Tiburcio Pea M.D.   On: 08/17/2023 05:41    Procedures Procedures    Medications Ordered in ED Medications - No data to display  ED Course/ Medical Decision Making/ A&P                                 Medical Decision Making Amount and/or Complexity of Data Reviewed Labs: ordered. Radiology: ordered.   Differential diagnosis considered includes, but not limited to: Stroke; TIA; MS; hyperventilation syndrome  Presents to the emergency department for evaluation of intermittent  tingling in her extremities.  Tingling seems to be mostly in the hands and feet, not the entire extremity.  She feels anxious and has an increased respiratory rate at times when this occurs.  This sounds  most consistent with anxiety and hyperventilation syndrome.  Her workup is reassuring.  Chest x-ray clear.  EKG without arrhythmia or other changes.  Basic electrolytes unremarkable.        Final Clinical Impression(s) / ED Diagnoses Final diagnoses:  Anxiety    Rx / DC Orders ED Discharge Orders          Ordered    hydrOXYzine (ATARAX) 25 MG tablet  Every 8 hours PRN        08/17/23 0606              Gilda Crease, MD 08/17/23 [phone]

## 2023-08-17 NOTE — ED Notes (Signed)
Pt states she was having shortness of breath with tingling while walking to the restroom. Pt states she does have a hx of anxiety. Pt also states that brother and sister in law have covid, however they are quarantining in the house and they have had no contact, they also took home texts and they were negative.

## 2023-08-17 NOTE — ED Notes (Signed)
Patient transported to X-ray 

## 2023-08-17 NOTE — ED Triage Notes (Signed)
Pt c/o intermittent tingling in arms and legs x3 weeks.

## 2023-10-18 ENCOUNTER — Other Ambulatory Visit: Payer: Self-pay

## 2023-10-18 ENCOUNTER — Encounter (HOSPITAL_COMMUNITY): Payer: Self-pay | Admitting: *Deleted

## 2023-10-18 ENCOUNTER — Emergency Department (HOSPITAL_COMMUNITY)
Admission: EM | Admit: 2023-10-18 | Discharge: 2023-10-18 | Disposition: A | Discharge status: Home / Self Care | Payer: BC Managed Care – PPO | Attending: Emergency Medicine | Admitting: Emergency Medicine

## 2023-10-18 DIAGNOSIS — R040 Epistaxis: Secondary | ICD-10-CM

## 2023-10-18 MED ORDER — OXYMETAZOLINE HCL 0.05 % NA SOLN
1.0000 | Freq: Once | NASAL | Status: AC
  Administered 2023-10-18: 1 via NASAL
  Filled 2023-10-18: qty 30

## 2023-10-18 NOTE — ED Provider Notes (Signed)
[place] EMERGENCY DEPARTMENT AT [facility] Provider Note   CSN: [id number] Arrival date & time: 10/18/23  0805     History  Chief Complaint  Patient presents with   Epistaxis    Rachael Walsh is a 27 y.o. female.  Who is otherwise healthy presenting for nosebleed.  Beginning this morning she began to experience dry cough and nasal congestion and has been blowing her nose all morning.  Nosebleed started at work and persisted for 10 minutes prompting her to seek evaluation in the ED.  The bleeding did stop when she got to the ED parking lot.  She had a history of nosebleeds as a child but has not had any recently.  No other history of prolonged bleeding or clotting disorders and no family history of bleeding disorders.   Epistaxis      Home Medications Prior to Admission medications   Medication Sig Start Date End Date Taking? Authorizing Provider  famotidine (PEPCID) 20 MG tablet Take 1 tablet (20 mg total) by mouth 2 (two) times daily. Patient taking differently: Take 20 mg by mouth 2 (two) times daily. As needed 03/27/21   Sharman Cheek, MD  hydrOXYzine (ATARAX) 25 MG tablet Take 1 tablet (25 mg total) by mouth every 8 (eight) hours as needed for anxiety. 08/17/23   Gilda Crease, MD  ibuprofen (ADVIL) 600 MG tablet Take 1 tablet (600 mg total) by mouth every 6 (six) hours as needed. 09/04/20   Enid Derry, PA-C  lidocaine (LIDODERM) 5 % Place 1 patch onto the skin daily. Remove & Discard patch within 12 hours or as directed by MD 11/18/22   Sabas Sous, MD  naproxen (NAPROSYN) 500 MG tablet Take 1 tablet (500 mg total) by mouth 2 (two) times daily with a meal. 11/18/22   Eber Hong, MD  ondansetron (ZOFRAN ODT) 4 MG disintegrating tablet Take 1 tablet (4 mg total) by mouth every 8 (eight) hours as needed for nausea or vomiting. 03/27/21   Sharman Cheek, MD  ondansetron (ZOFRAN-ODT) 4 MG disintegrating tablet Take 1 tablet (4 mg total) by mouth  every 8 (eight) hours as needed for nausea or vomiting. 12/27/22   Horton, Mayer Masker, MD  potassium chloride (KLOR-CON) 10 MEQ tablet Take 1 tablet (10 mEq total) by mouth daily. 09/04/20   Enid Derry, PA-C      Allergies    Patient has no known allergies.    Review of Systems   Review of Systems  HENT:  Positive for nosebleeds.     Physical Exam Updated Vital Signs BP 96/71 (BP Location: Left Arm)   Pulse 68   Temp 98 F (36.7 C) (Oral)   Resp 18   Ht 5\' 2"  (1.575 m)   Wt 86.2 kg   LMP 10/11/2023   SpO2 99%   BMI 34.75 kg/m  Physical Exam Vitals and nursing note reviewed.  HENT:     Head: Normocephalic and atraumatic.     Nose:     Comments: Boggy nasal turbinates bilaterally with dried blood in the left naris Eyes:     Pupils: Pupils are equal, round, and reactive to light.  Cardiovascular:     Rate and Rhythm: Normal rate and regular rhythm.  Pulmonary:     Effort: Pulmonary effort is normal.     Breath sounds: Normal breath sounds.  Abdominal:     Palpations: Abdomen is soft.     Tenderness: There is no abdominal tenderness.  Skin:  General: Skin is warm and dry.  Neurological:     Mental Status: She is alert.  Psychiatric:        Mood and Affect: Mood normal.     ED Results / Procedures / Treatments   Labs (all labs ordered are listed, but only abnormal results are displayed) Labs Reviewed - No data to display  EKG None  Radiology No results found.  Procedures Procedures    Medications Ordered in ED Medications  oxymetazoline (AFRIN) 0.05 % nasal spray 1 spray (has no administration in time range)    ED Course/ Medical Decision Making/ A&P                                 Medical Decision Making Healthy 27 year old female presenting for epistaxis.  Bleeding from left naris persisted for 10 minutes but stopped in the parking lot here.  No prior history of prolonged bleeding or family history.  Exam shows boggy nasal turbinates with  dried blood in the left naris and no active bleeding.  Will have her apply Afrin in both nares as this may help with her nasal congestion as well continue to monitor for any rebleeding.  Will instruct on symptomatic management of nosebleeds at home and discussed return precautions.  Risk OTC drugs.           Final Clinical Impression(s) / ED Diagnoses Final diagnoses:  Epistaxis    Rx / DC Orders ED Discharge Orders     None         Royanne Foots, DO 10/18/23 1610

## 2023-10-18 NOTE — Discharge Instructions (Addendum)
You were seen in the emergency department after a nosebleed Fortunately the bleeding stopped on its own We applied Afrin to both of your nostrils which can help with your nasal congestion as well as prevent any rebleeding You can apply Afrin 2 to 3 sprays up to every 12 hours It is important that you do not use it more than this as this can cause "rebound" nosebleeds If your nose bleeds again apply the Afrin and clamp your nose shut for 10 minutes If it continues to bleed come back to the emergency department

## 2023-10-18 NOTE — ED Triage Notes (Signed)
Pt c/o nosebleed this morning while at work around 0740 that lasted for 10 minutes. Pt reports she couldn't get the bleeding to stop so she came to the emergency room. After she arrived to the ED, the bleeding stopped. Pt reports last night at work she started to feel weak all over, had a runny nose, and cough.

## 2023-11-27 ENCOUNTER — Emergency Department (HOSPITAL_COMMUNITY)
Admission: EM | Admit: 2023-11-27 | Discharge: 2023-11-27 | Discharge status: Against Medical Advice | Payer: Managed Care, Other (non HMO)

## 2023-11-27 NOTE — ED Notes (Signed)
Patient left.

## 2024-08-02 ENCOUNTER — Emergency Department (HOSPITAL_COMMUNITY)
Admission: EM | Admit: 2024-08-02 | Discharge: 2024-08-02 | Disposition: A | Discharge status: Home / Self Care | Attending: Emergency Medicine | Admitting: Emergency Medicine

## 2024-08-02 ENCOUNTER — Other Ambulatory Visit: Payer: Self-pay

## 2024-08-02 DIAGNOSIS — M25561 Pain in right knee: Secondary | ICD-10-CM | POA: Insufficient documentation

## 2024-08-02 DIAGNOSIS — L729 Follicular cyst of the skin and subcutaneous tissue, unspecified: Secondary | ICD-10-CM

## 2024-08-02 NOTE — ED Provider Notes (Signed)
 [place] EMERGENCY DEPARTMENT AT [facility] Provider Note   CSN: [id number] Arrival date & time: 08/02/24  0104     Patient presents with: Leg Pain   Rachael Walsh is a 28 y.o. female.   Patient presents with concerns over not the right leg.  Patient reports that she became aware of a nontender knot just below the right knee and is worried that it is something serious.       Prior to Admission medications   Medication Sig Start Date End Date Taking? Authorizing Provider  famotidine  (PEPCID ) 20 MG tablet Take 1 tablet (20 mg total) by mouth 2 (two) times daily. Patient taking differently: Take 20 mg by mouth 2 (two) times daily. As needed 03/27/21   Viviann Pastor, MD  hydrOXYzine  (ATARAX ) 25 MG tablet Take 1 tablet (25 mg total) by mouth every 8 (eight) hours as needed for anxiety. 08/17/23   Haze Lonni PARAS, MD  ibuprofen  (ADVIL ) 600 MG tablet Take 1 tablet (600 mg total) by mouth every 6 (six) hours as needed. 09/04/20   Alona Knee, PA-C  lidocaine  (LIDODERM ) 5 % Place 1 patch onto the skin daily. Remove & Discard patch within 12 hours or as directed by MD 11/18/22   Theadore Ozell HERO, MD  naproxen  (NAPROSYN ) 500 MG tablet Take 1 tablet (500 mg total) by mouth 2 (two) times daily with a meal. 11/18/22   Cleotilde Rogue, MD  ondansetron  (ZOFRAN  ODT) 4 MG disintegrating tablet Take 1 tablet (4 mg total) by mouth every 8 (eight) hours as needed for nausea or vomiting. 03/27/21   Viviann Pastor, MD  ondansetron  (ZOFRAN -ODT) 4 MG disintegrating tablet Take 1 tablet (4 mg total) by mouth every 8 (eight) hours as needed for nausea or vomiting. 12/27/22   Horton, Charmaine FALCON, MD  potassium chloride  (KLOR-CON ) 10 MEQ tablet Take 1 tablet (10 mEq total) by mouth daily. 09/04/20   Alona Knee, PA-C    Allergies: Patient has no known allergies.    Review of Systems  Updated Vital Signs BP 116/84   Pulse 82   Temp 98.7 F (37.1 C) (Oral)   Resp 16   Ht 5' 2 (1.575  m)   Wt 86.2 kg   LMP 07/17/2024   SpO2 99%   BMI 34.75 kg/m   Physical Exam HENT:     Head: Normocephalic and atraumatic.  Musculoskeletal:        General: No swelling. Normal range of motion.  Neurological:     Mental Status: She is alert.     (all labs ordered are listed, but only abnormal results are displayed) Labs Reviewed - No data to display  EKG: None  Radiology: No results found.   Procedures   Medications Ordered in the ED - No data to display                                  Medical Decision Making  Presents with a tiny knot below the right knee.  This is palpable in the subcutaneous tissues, no overlying skin changes.  Patient with normal range of motion of the knee.  She is not experiencing any pain.  Presentation consistent with benign cyst.  No further workup necessary.     Final diagnoses:  None    ED Discharge Orders     None          Molley Houser, Lonni PARAS, MD 08/02/24 [phone]

## 2024-08-02 NOTE — Discharge Instructions (Addendum)
 You have a cyst in the skin of your leg that is not serious. It doesn't need any specific treatment.

## 2024-08-02 NOTE — ED Triage Notes (Signed)
 Pt c/o knot to the right leg below knee x one week.
